# Patient Record
Sex: Female | Born: 1965 | Race: Black or African American | Hispanic: No | Marital: Married | Smoking: Current some day smoker
Health system: Southern US, Community
[De-identification: ages and names within clinical notes are randomized; demographics above are authoritative.]

## PROBLEM LIST (undated history)

## (undated) DIAGNOSIS — I10 Essential (primary) hypertension: Secondary | ICD-10-CM

## (undated) HISTORY — PX: NO PAST SURGERIES: SHX2092

---

## 1999-01-18 ENCOUNTER — Other Ambulatory Visit: Admission: RE | Admit: 1999-01-18 | Discharge: 1999-01-18 | Payer: Self-pay | Admitting: *Deleted

## 2002-11-09 ENCOUNTER — Emergency Department (HOSPITAL_COMMUNITY): Admission: EM | Admit: 2002-11-09 | Discharge: 2002-11-09 | Payer: Self-pay | Admitting: Emergency Medicine

## 2003-04-01 ENCOUNTER — Encounter: Admission: RE | Admit: 2003-04-01 | Discharge: 2003-04-01 | Payer: Self-pay | Admitting: Family Medicine

## 2003-04-01 ENCOUNTER — Encounter: Payer: Self-pay | Admitting: Family Medicine

## 2003-04-06 ENCOUNTER — Encounter: Payer: Self-pay | Admitting: Family Medicine

## 2003-04-06 ENCOUNTER — Ambulatory Visit (HOSPITAL_COMMUNITY): Admission: RE | Admit: 2003-04-06 | Discharge: 2003-04-06 | Payer: Self-pay | Admitting: Family Medicine

## 2003-06-14 ENCOUNTER — Emergency Department (HOSPITAL_COMMUNITY): Admission: EM | Admit: 2003-06-14 | Discharge: 2003-06-14 | Payer: Self-pay | Admitting: Emergency Medicine

## 2009-12-01 ENCOUNTER — Emergency Department (HOSPITAL_COMMUNITY): Admission: EM | Admit: 2009-12-01 | Discharge: 2009-12-01 | Payer: Self-pay | Admitting: Family Medicine

## 2011-03-04 DEATH — deceased

## 2012-01-04 DIAGNOSIS — L209 Atopic dermatitis, unspecified: Secondary | ICD-10-CM | POA: Insufficient documentation

## 2012-01-05 DIAGNOSIS — IMO0001 Reserved for inherently not codable concepts without codable children: Secondary | ICD-10-CM | POA: Insufficient documentation

## 2012-01-28 ENCOUNTER — Other Ambulatory Visit: Payer: Self-pay | Admitting: Family Medicine

## 2012-01-28 DIAGNOSIS — Z1231 Encounter for screening mammogram for malignant neoplasm of breast: Secondary | ICD-10-CM

## 2012-01-31 DIAGNOSIS — E559 Vitamin D deficiency, unspecified: Secondary | ICD-10-CM | POA: Insufficient documentation

## 2012-01-31 DIAGNOSIS — A599 Trichomoniasis, unspecified: Secondary | ICD-10-CM | POA: Insufficient documentation

## 2012-02-04 ENCOUNTER — Ambulatory Visit: Payer: Self-pay

## 2012-02-27 ENCOUNTER — Ambulatory Visit
Admission: RE | Admit: 2012-02-27 | Discharge: 2012-02-27 | Disposition: A | Discharge status: Home / Self Care | Payer: BC Managed Care – PPO | Source: Ambulatory Visit | Attending: Family Medicine | Admitting: Family Medicine

## 2012-02-27 DIAGNOSIS — Z1231 Encounter for screening mammogram for malignant neoplasm of breast: Secondary | ICD-10-CM

## 2012-07-24 DIAGNOSIS — L918 Other hypertrophic disorders of the skin: Secondary | ICD-10-CM | POA: Insufficient documentation

## 2012-07-24 DIAGNOSIS — B079 Viral wart, unspecified: Secondary | ICD-10-CM | POA: Insufficient documentation

## 2013-07-29 ENCOUNTER — Other Ambulatory Visit: Payer: Self-pay

## 2013-07-29 DIAGNOSIS — Z1231 Encounter for screening mammogram for malignant neoplasm of breast: Secondary | ICD-10-CM

## 2013-08-18 ENCOUNTER — Ambulatory Visit: Payer: BC Managed Care – PPO

## 2013-09-01 ENCOUNTER — Ambulatory Visit
Admission: RE | Admit: 2013-09-01 | Discharge: 2013-09-01 | Disposition: A | Discharge status: Home / Self Care | Payer: BC Managed Care – PPO | Source: Ambulatory Visit

## 2013-09-01 DIAGNOSIS — Z1231 Encounter for screening mammogram for malignant neoplasm of breast: Secondary | ICD-10-CM

## 2014-07-01 ENCOUNTER — Other Ambulatory Visit (HOSPITAL_COMMUNITY): Payer: Self-pay | Admitting: Family Medicine

## 2014-07-01 DIAGNOSIS — Z1231 Encounter for screening mammogram for malignant neoplasm of breast: Secondary | ICD-10-CM

## 2014-11-04 ENCOUNTER — Encounter: Payer: Self-pay | Admitting: Podiatry

## 2014-11-04 ENCOUNTER — Ambulatory Visit (INDEPENDENT_AMBULATORY_CARE_PROVIDER_SITE_OTHER): Payer: BC Managed Care – PPO

## 2014-11-04 ENCOUNTER — Ambulatory Visit (INDEPENDENT_AMBULATORY_CARE_PROVIDER_SITE_OTHER): Payer: BC Managed Care – PPO | Admitting: Podiatry

## 2014-11-04 VITALS — BP 164/112 | HR 89 | Resp 16

## 2014-11-04 DIAGNOSIS — M79672 Pain in left foot: Secondary | ICD-10-CM

## 2014-11-04 DIAGNOSIS — M898X9 Other specified disorders of bone, unspecified site: Secondary | ICD-10-CM

## 2014-11-04 DIAGNOSIS — M779 Enthesopathy, unspecified: Secondary | ICD-10-CM

## 2014-11-04 MED ORDER — TRIAMCINOLONE ACETONIDE 10 MG/ML IJ SUSP
10.0000 mg | Freq: Once | INTRAMUSCULAR | Status: AC
Start: 2014-11-04 — End: 2014-11-04
  Administered 2014-11-04: 10 mg

## 2014-11-04 NOTE — Progress Notes (Signed)
   Subjective:    Patient ID: Judith Payne, female    DOB: [birth year], 48 y.o.   MRN: [id number]  HPI Comments: "I have a place on top of this foot"  Patient c/o tender dorsal foot left for about 1 month. There is a knot. Uncomfortable with certain shoes. Standing for long periods makes worse. She has been soaking in epsom salt, rubbing alcohol on the area and applying icy hot.   Foot Pain Associated symptoms include a rash.      Review of Systems  Eyes: Positive for redness.  Skin: Positive for rash.  All other systems reviewed and are negative.      Objective:   Physical Exam        Assessment & Plan:

## 2014-11-04 NOTE — Progress Notes (Signed)
Subjective:     Patient ID: Judith Payne, female   DOB: 14-Apr-1966, 48 y.o.   MRN: [id number]  HPI patient presents stating I'm having pain on top of my left foot that's been there for about 4-6 weeks. Certain shoes bother more than others and I have tried icy hot without relief   Review of Systems  All other systems reviewed and are negative.      Objective:   Physical Exam  Constitutional: She is oriented to person, place, and time.  Cardiovascular: Intact distal pulses.   Musculoskeletal: Normal range of motion.  Neurological: She is oriented to person, place, and time.  Skin: Skin is warm.  Nursing note and vitals reviewed.  neurovascular status found to be intact with muscle strength adequate and range of motion subtalar midtarsal joint within normal limits. Patient is noted to have inflammation on the dorsal the left foot with fluid buildup around the midtarsal joint and extensor tendon inflammation. No indication of tendon tear or breakdown     Assessment:     Probable tendinitis dorsal left cannot rule out bone injury    Plan:     H&P and x-rays reviewed. Today I injected the dorsal tissue 3 mg Kenalog 5 mg Xylocaine and instructed on physical therapy and wider-type shoes. Reappoint to recheck again as needed if symptoms were to recur or persist

## 2014-11-12 ENCOUNTER — Ambulatory Visit: Payer: BC Managed Care – PPO

## 2015-07-26 ENCOUNTER — Emergency Department (INDEPENDENT_AMBULATORY_CARE_PROVIDER_SITE_OTHER)
Admission: EM | Admit: 2015-07-26 | Discharge: 2015-07-26 | Disposition: A | Discharge status: Home / Self Care | Payer: BC Managed Care – PPO | Source: Home / Self Care | Attending: Emergency Medicine | Admitting: Emergency Medicine

## 2015-07-26 ENCOUNTER — Encounter (HOSPITAL_COMMUNITY): Payer: Self-pay | Admitting: Emergency Medicine

## 2015-07-26 DIAGNOSIS — D179 Benign lipomatous neoplasm, unspecified: Secondary | ICD-10-CM | POA: Diagnosis not present

## 2015-07-26 DIAGNOSIS — I10 Essential (primary) hypertension: Secondary | ICD-10-CM

## 2015-07-26 DIAGNOSIS — R1900 Intra-abdominal and pelvic swelling, mass and lump, unspecified site: Secondary | ICD-10-CM | POA: Diagnosis not present

## 2015-07-26 HISTORY — DX: Essential (primary) hypertension: I10

## 2015-07-26 NOTE — ED Notes (Addendum)
Pt wore a girdle to church on Sunday and when she took it off she noticed a knot deep in her mid/right abdomen.  The knot does not cause any pain or discomfort.  When pt was checked in she was noted to have a BP of 212/146 in her left lower arm.  Her left upper arm was 199/135.  Pt is asymptomatic with no elevated or lowered HR, no CP, no SOB and no h/a.  Pt also stated she wanted a chest xray for a pulled muscle she thinks she has in her chest and also mentioned a mammogram. I explained to her that we do not do mammograms here and that an xray will not diagnose a pulled muscle.

## 2015-07-26 NOTE — ED Provider Notes (Signed)
CSN: [id number]     Arrival date & time 07/26/15  1737 History   First MD Initiated Contact with Patient 07/26/15 1944     Chief Complaint  Patient presents with  . Cyst  . Hypertension   (Consider location/radiation/quality/duration/timing/severity/associated sxs/prior Treatment) HPI Comments: 49 year old female states that after she pulled her gargle off Sunday, 2 days ago she felt a "knot" in her right upper quadrant just below the rib cage. She states that there is no pain or tenderness. She has no GI symptoms. No abdominal pain, nausea, vomiting or bowel problems. She denies tenderness with her palpation. Not increasing in size. Nothing makes it worse or better, is painless. Not associated with other sx's. No hx trauma.   Past Medical History  Diagnosis Date  . Hypertension    History reviewed. No pertinent past surgical history. History reviewed. No pertinent family history. Social History  Substance Use Topics  . Smoking status: Current Some Day Smoker  . Smokeless tobacco: None  . Alcohol Use: 0.0 oz/week    0 Standard drinks or equivalent per week   OB History    No data available     Review of Systems  Constitutional: Negative.   Respiratory: Negative.  Negative for cough, choking, chest tightness and shortness of breath.   Cardiovascular: Negative for chest pain, palpitations and leg swelling.  Gastrointestinal: Negative for nausea, vomiting, abdominal pain, constipation and abdominal distention.  Skin: Negative.   Neurological: Negative for tremors, syncope, speech difficulty and headaches.  Psychiatric/Behavioral: Negative.   All other systems reviewed and are negative.   Allergies  Review of patient's allergies indicates no known allergies.  Home Medications   Prior to Admission medications   Not on File   BP 212/146 mmHg  Pulse 73  Temp(Src) 98.5 F (36.9 C) (Oral)  Resp 16  SpO2 98% Physical Exam  Constitutional: She is oriented to person, place,  and time. She appears well-developed and well-nourished. No distress.  HENT:  Head: Normocephalic and atraumatic.  Eyes: EOM are normal. Pupils are equal, round, and reactive to light.  Neck: Normal range of motion. Neck supple.  Cardiovascular: Normal rate and normal heart sounds.   No murmur heard. Pulmonary/Chest: Effort normal and breath sounds normal. No respiratory distress. She has no wheezes.  Abdominal: Soft. There is no tenderness.  There is a firm mass in the right upper quadrant approximately 2.5-3 cm inferior to the costal margin. It is nontender. It is mobile and within the subcutaneous tissue. The liver is not palpable. There is no costal margin or rib pain. No abdominal tenderness. No pulsations associated with the mass.  Musculoskeletal: Normal range of motion.  Neurological: She is alert and oriented to person, place, and time. No cranial nerve deficit.  Skin: Skin is warm and dry.  Psychiatric: She has a normal mood and affect.  Nursing note and vitals reviewed.   ED Course  Procedures (including critical care time) Labs Review Labs Reviewed - No data to display  Imaging Review No results found.   MDM   1. Abdominal mass   2. Lipoma   3. Essential hypertension    Suspect this mobile mass in the right upper quadrant is a lipoma. However, it is advised that she see a PCP and possibly have this ultrasound for certainty. Her blood pressures are elevated and will need to have that managed as well. She is also advised to stop smoking. She may call in the PCP and she would like. She  was advised we do not have any referrals for PCP but may Google or call [facility] since you mentioned them.    Janne Napoleon, NP 07/26/15 2010  Janne Napoleon, NP 07/26/15 2011

## 2015-07-26 NOTE — Discharge Instructions (Signed)
Hypertension °Hypertension, commonly called high blood pressure, is when the force of blood pumping through your arteries is too strong. Your arteries are the blood vessels that carry blood from your heart throughout your body. A blood pressure reading consists of a higher number over a lower number, such as 110/72. The higher number (systolic) is the pressure inside your arteries when your heart pumps. The lower number (diastolic) is the pressure inside your arteries when your heart relaxes. Ideally you want your blood pressure below 120/80. °Hypertension forces your heart to work harder to pump blood. Your arteries may become narrow or stiff. Having hypertension puts you at risk for heart disease, stroke, and other problems.  °RISK FACTORS °Some risk factors for high blood pressure are controllable. Others are not.  °Risk factors you cannot control include:  °· Race. You may be at higher risk if you are African American. °· Age. Risk increases with age. °· Gender. Men are at higher risk than women before age 45 years. After age 65, women are at higher risk than men. °Risk factors you can control include: °· Not getting enough exercise or physical activity. °· Being overweight. °· Getting too much fat, sugar, calories, or salt in your diet. °· Drinking too much alcohol. °SIGNS AND SYMPTOMS °Hypertension does not usually cause signs or symptoms. Extremely high blood pressure (hypertensive crisis) may cause headache, anxiety, shortness of breath, and nosebleed. °DIAGNOSIS  °To check if you have hypertension, your health care provider will measure your blood pressure while you are seated, with your arm held at the level of your heart. It should be measured at least twice using the same arm. Certain conditions can cause a difference in blood pressure between your right and left arms. A blood pressure reading that is higher than normal on one occasion does not mean that you need treatment. If one blood pressure reading  is high, ask your health care provider about having it checked again. °TREATMENT  °Treating high blood pressure includes making lifestyle changes and possibly taking medicine. Living a healthy lifestyle can help lower high blood pressure. You may need to change some of your habits. °Lifestyle changes may include: °· Following the DASH diet. This diet is high in fruits, vegetables, and whole grains. It is low in salt, red meat, and added sugars. °· Getting at least 2½ hours of brisk physical activity every week. °· Losing weight if necessary. °· Not smoking. °· Limiting alcoholic beverages. °· Learning ways to reduce stress. ° If lifestyle changes are not enough to get your blood pressure under control, your health care provider may prescribe medicine. You may need to take more than one. Work closely with your health care provider to understand the risks and benefits. °HOME CARE INSTRUCTIONS °· Have your blood pressure rechecked as directed by your health care provider.   °· Take medicines only as directed by your health care provider. Follow the directions carefully. Blood pressure medicines must be taken as prescribed. The medicine does not work as well when you skip doses. Skipping doses also puts you at risk for problems.   °· Do not smoke.   °· Monitor your blood pressure at home as directed by your health care provider.  °SEEK MEDICAL CARE IF:  °· You think you are having a reaction to medicines taken. °· You have recurrent headaches or feel dizzy. °· You have swelling in your ankles. °· You have trouble with your vision. °SEEK IMMEDIATE MEDICAL CARE IF: °· You develop a severe headache or confusion. °·   You have unusual weakness, numbness, or feel faint.  You have severe chest or abdominal pain.  You vomit repeatedly.  You have trouble breathing. MAKE SURE YOU:   Understand these instructions.  Will watch your condition.  Will get help right away if you are not doing well or get worse. Document  Released: 11/19/2005 Document Revised: 04/05/2014 Document Reviewed: 09/11/2013 [facility] Patient Information 2015 [place], [place]. This information is not intended to replace advice given to you by your health care provider. Make sure you discuss any questions you have with your health care provider.  Heart Disease Prevention Heart disease can lead to heart attacks and strokes. This is a leading cause of death. Heart disease can be inherited and can be caused from the lifestyle you lead. You can do a lot to keep your heart and blood vessels healthy.  WHAT SHOULD I DO EACH DAY TO KEEP MY HEART HEALTHY?  Do not smoke.  Follow a healthy eating plan as recommended by your caregiver or dietitian.  Be active for a total of 30 minutes most days. Ask your caregiver what activities are best for you.  Limit the amount of alcohol you drink.  Involve family and friends to help you with a healthy lifestyle. HOW DOES HEART DISEASE CAUSE HIGH BLOOD PRESSURE?  Narrowed blood vessels leave a smaller opening for blood to flow through. It is like turning on a garden hose and holding your thumb over the opening. The smaller opening makes the water shoot out with more pressure. In the same way, narrowed blood vessels can lead to high blood pressure. Other factors, such as kidney problems and being overweight, also can lead to high blood pressure.  If you have high blood pressure you may need to take blood pressure medicine every day. Some types of blood pressure medicine can also help keep your kidneys healthy.  Many people with diabetes also have high blood pressure. If you have heart, eye, or kidney problems from diabetes, high blood pressure can make them worse. HOW DO MY BLOOD VESSELS GET CLOGGED?  Cholesterol is a substance that is made by the body and used for many important functions. It is also found in food that comes from animals. When your cholesterol is high, it can stick to the insides of your blood  vessels, making them narrowed and even clogged. This problem is called atherosclerosis.  Narrowed and clogged blood vessels make it harder for blood to get to important body organs. This can cause problems such as:  Chest pain (angina). Angina can cause temporary pain in your chest, arms, shoulders, or back. You may feel the pain more when your heart beats faster, such as when you exercise. The pain may go away when you rest. You also may feel very weak and sweaty.  A heart attack. A heart attack happens when a blood vessel in or near the heart becomes blocked. Not enough blood is getting to the heart. During a heart attack, you may have chest pain in your chest, arms, shoulders, or back along with nausea, indigestion, extreme weakness, and sweating. WHAT CAN I DO TO PREVENT HEART DISEASE?   Keep your blood pressure under control as recommended by your caregiver.  Keep your cholesterol under control. Have it checked at least once a year. Target cholesterol levels for most people are:  Total blood cholesterol level: Below 200.  LDL (bad) cholesterol: Below 100.  HDL (good) cholesterol: Above 40 in men and above 50 in women.  Triglycerides (  another type of fat in the blood): Below 150.  Make physical activity a part of your daily routine. Check with your caregiver to learn what activities are best for you.  Make sure that the foods you eat are "heart-healthy."  Include foods high in fiber, such as oat bran, oatmeal, whole-grain breads and cereals.  Cut back on fried foods and foods high in saturated fat. This includes foods such as meats, butter, whole dairy products, shortening, and coconut or palm oil.  Avoid salty foods such as canned food, luncheon meat, salty snacks, and fast food.  Eat more fruits and vegetables.  Drink less alcohol.  Lose weight as recommended by your caregiver.  If you smoke, quit. Your caregiver can help you with quitting options.  Ask your caregiver  whether you should take a daily aspirin. Studies have shown that taking aspirin can help reduce your risk of heart disease and stroke.  Take your prescribed medicines as directed. WHAT ARE THE WARNING SIGNS OF A HEART ATTACK? You may have one or more of the following warning signs:  Chest pain or discomfort.  Pain or discomfort in your arms, back, jaw, or neck.  Indigestion or stomach pain.  Shortness of breath.  Sweating.  Nausea or vomiting.  Lightheadedness.  No warning signs at all or they may come and go. FOR MORE INFORMATION  To find out more about heart disease and stroke prevention, visit the American Heart Association website at www.americanheart.org Document Released: 07/03/2004 Document Revised: 05/20/2012 Document Reviewed: 01/13/2014 [facility] Patient Information 2015 [place], [place]. This information is not intended to replace advice given to you by your health care provider. Make sure you discuss any questions you have with your health care provider.

## 2015-07-28 ENCOUNTER — Ambulatory Visit (INDEPENDENT_AMBULATORY_CARE_PROVIDER_SITE_OTHER): Payer: BC Managed Care – PPO | Admitting: Internal Medicine

## 2015-07-28 ENCOUNTER — Other Ambulatory Visit (INDEPENDENT_AMBULATORY_CARE_PROVIDER_SITE_OTHER): Payer: BC Managed Care – PPO

## 2015-07-28 ENCOUNTER — Encounter: Payer: Self-pay | Admitting: Internal Medicine

## 2015-07-28 VITALS — BP 160/120 | HR 85 | Temp 98.7°F | Resp 14 | Ht 63.0 in | Wt 177.3 lb

## 2015-07-28 DIAGNOSIS — R1900 Intra-abdominal and pelvic swelling, mass and lump, unspecified site: Secondary | ICD-10-CM

## 2015-07-28 DIAGNOSIS — Z0000 Encounter for general adult medical examination without abnormal findings: Secondary | ICD-10-CM | POA: Diagnosis not present

## 2015-07-28 DIAGNOSIS — I10 Essential (primary) hypertension: Secondary | ICD-10-CM

## 2015-07-28 LAB — COMPREHENSIVE METABOLIC PANEL
ALK PHOS: 83 U/L (ref 39–117)
ALT: 40 U/L — ABNORMAL HIGH (ref 0–35)
AST: 27 U/L (ref 0–37)
Albumin: 4.3 g/dL (ref 3.5–5.2)
BUN: 17 mg/dL (ref 6–23)
CO2: 30 meq/L (ref 19–32)
Calcium: 10.5 mg/dL (ref 8.4–10.5)
Chloride: 103 mEq/L (ref 96–112)
Creatinine, Ser: 0.89 mg/dL (ref 0.40–1.20)
GFR: 86.14 mL/min (ref >60.00)
GLUCOSE: 125 mg/dL — AB (ref 70–99)
POTASSIUM: 4.7 meq/L (ref 3.5–5.1)
Sodium: 139 mEq/L (ref 135–145)
Total Bilirubin: 0.4 mg/dL (ref 0.2–1.2)
Total Protein: 7.6 g/dL (ref 6.0–8.3)

## 2015-07-28 LAB — LIPID PANEL
CHOL/HDL RATIO: 5
CHOLESTEROL: 208 mg/dL — AB (ref 0–200)
HDL: 45.3 mg/dL (ref >39.00)
LDL CALC: 143 mg/dL — AB (ref 0–99)
NonHDL: 162.51
TRIGLYCERIDES: 97 mg/dL (ref 0.0–149.0)
VLDL: 19.4 mg/dL (ref 0.0–40.0)

## 2015-07-28 LAB — CBC
HEMATOCRIT: 45.8 % (ref 36.0–46.0)
Hemoglobin: 15.1 g/dL — ABNORMAL HIGH (ref 12.0–15.0)
MCHC: 32.8 g/dL (ref 30.0–36.0)
MCV: 84.1 fl (ref 78.0–100.0)
PLATELETS: 253 10*3/uL (ref 150.0–400.0)
RBC: 5.45 Mil/uL — ABNORMAL HIGH (ref 3.87–5.11)
RDW: 14.3 % (ref 11.5–15.5)
WBC: 6.6 10*3/uL (ref 4.0–10.5)

## 2015-07-28 LAB — HEMOGLOBIN A1C: Hgb A1c MFr Bld: 5.8 % (ref 4.6–6.5)

## 2015-07-28 MED ORDER — LISINOPRIL-HYDROCHLOROTHIAZIDE 20-12.5 MG PO TABS: 1.0000 | ORAL_TABLET | Freq: Every day | ORAL | Status: DC

## 2015-07-28 NOTE — Progress Notes (Signed)
   Subjective:    Patient ID: Judith Payne, female    DOB: 1966/08/22, 49 y.o.   MRN: [id number]  HPI The patient is a 49 YO female coming in for high blood pressure. She was going to urgent care to check out a lump on her stomach and had severe hypertension. She was not given any medication but told to see a PCP from urgent care. Not feeling that well but no headaches. No chest pains or SOB or nausea or confusion. Has not been on any medications.   Review of Systems  Constitutional: Positive for fatigue. Negative for fever, chills, activity change, appetite change and unexpected weight change.  HENT: Negative.   Eyes: Negative.   Respiratory: Negative for cough, chest tightness, shortness of breath and wheezing.   Cardiovascular: Negative for chest pain, palpitations and leg swelling.  Gastrointestinal: Negative for nausea, abdominal pain, diarrhea, constipation and abdominal distention.  Musculoskeletal: Negative.   Skin: Negative.   Neurological: Negative for dizziness, weakness, light-headedness and headaches.  Psychiatric/Behavioral: Negative.       Objective:   Physical Exam  Constitutional: She is oriented to person, place, and time. She appears well-developed and well-nourished.  HENT:  Head: Normocephalic and atraumatic.  Eyes: EOM are normal. Pupils are equal, round, and reactive to light.  Neck: Normal range of motion.  Cardiovascular: Normal rate and regular rhythm.   Pulmonary/Chest: Effort normal and breath sounds normal. No respiratory distress. She has no wheezes. She has no rales.  Abdominal: Soft. Bowel sounds are normal. She exhibits no distension. There is no tenderness. There is no rebound.  Musculoskeletal: She exhibits no edema.  Neurological: She is alert and oriented to person, place, and time. Coordination normal.  Skin: Skin is warm and dry.  Psychiatric: She has a normal mood and affect.   Filed Vitals:   07/28/15 0857  BP: 160/120  Pulse: 85  Temp:  98.7 F (37.1 C)  TempSrc: Oral  Resp: 14  Height: 5\' 3"  (1.6 m)  Weight: 177 lb 4.8 oz (80.423 kg)  SpO2: 98%      Assessment & Plan:

## 2015-07-28 NOTE — Progress Notes (Signed)
Pre visit review using our clinic review tool, if applicable. No additional management support is needed unless otherwise documented below in the visit note. 

## 2015-07-28 NOTE — Patient Instructions (Signed)
We will check the blood tests today and send in a blood pressure medicine for you. We will call you back with the results.   The medicine is called lisinopril/hctz and is 1 pill a day. This pill does tend to make you urinate more often and this is part of how it helps the blood pressure.   If you have any side effects or problems from the medicine please feel free to call us and we can help you.   Come back in 2-3 weeks so we can check the blood pressure and the blood work.    Hypertension Hypertension is another name for high blood pressure. High blood pressure forces your heart to work harder to pump blood. A blood pressure reading has two numbers, which includes a higher number over a lower number (example: 110/72). HOME CARE   Have your blood pressure rechecked by your doctor.  Only take medicine as told by your doctor. Follow the directions carefully. The medicine does not work as well if you skip doses. Skipping doses also puts you at risk for problems.  Do not smoke.  Monitor your blood pressure at home as told by your doctor. GET HELP IF:  You think you are having a reaction to the medicine you are taking.  You have repeat headaches or feel dizzy.  You have puffiness (swelling) in your ankles.  You have trouble with your vision. GET HELP RIGHT AWAY IF:   You get a very bad headache and are confused.  You feel weak, numb, or faint.  You get chest or belly (abdominal) pain.  You throw up (vomit).  You cannot breathe very well. MAKE SURE YOU:   Understand these instructions.  Will watch your condition.  Will get help right away if you are not doing well or get worse. Document Released: 05/07/2008 Document Revised: 11/24/2013 Document Reviewed: 09/11/2013 [facility] Patient Information 2015 [place], [place]. This information is not intended to replace advice given to you by your health care provider. Make sure you discuss any questions you have with your health care  provider.

## 2015-07-29 LAB — HIV ANTIBODY (ROUTINE TESTING W REFLEX): HIV 1&2 Ab, 4th Generation: NONREACTIVE

## 2015-07-31 DIAGNOSIS — R1900 Intra-abdominal and pelvic swelling, mass and lump, unspecified site: Secondary | ICD-10-CM | POA: Insufficient documentation

## 2015-07-31 DIAGNOSIS — I10 Essential (primary) hypertension: Secondary | ICD-10-CM | POA: Insufficient documentation

## 2015-07-31 NOTE — Assessment & Plan Note (Addendum)
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 Check US abdomen to confirm.

## 2015-07-31 NOTE — Assessment & Plan Note (Signed)
Starting her on 2 drug regimen with lisinopril/hctz. Checking CMP, CBC, lipid panel, HIV screening and adjust as needed. Return soon for BP recheck and labs after starting medication.

## 2015-08-02 ENCOUNTER — Emergency Department (HOSPITAL_COMMUNITY): Payer: BC Managed Care – PPO

## 2015-08-02 ENCOUNTER — Telehealth: Payer: Self-pay | Admitting: Internal Medicine

## 2015-08-02 ENCOUNTER — Observation Stay (HOSPITAL_COMMUNITY)
Admission: EM | Admit: 2015-08-02 | Discharge: 2015-08-02 | Disposition: A | Discharge status: Home / Self Care | Payer: BC Managed Care – PPO | Attending: Emergency Medicine | Admitting: Emergency Medicine

## 2015-08-02 ENCOUNTER — Encounter (HOSPITAL_COMMUNITY): Payer: Self-pay | Admitting: Emergency Medicine

## 2015-08-02 DIAGNOSIS — R079 Chest pain, unspecified: Secondary | ICD-10-CM | POA: Diagnosis not present

## 2015-08-02 DIAGNOSIS — E785 Hyperlipidemia, unspecified: Secondary | ICD-10-CM | POA: Insufficient documentation

## 2015-08-02 DIAGNOSIS — I160 Hypertensive urgency: Secondary | ICD-10-CM | POA: Diagnosis present

## 2015-08-02 DIAGNOSIS — F17210 Nicotine dependence, cigarettes, uncomplicated: Secondary | ICD-10-CM | POA: Insufficient documentation

## 2015-08-02 DIAGNOSIS — R0789 Other chest pain: Secondary | ICD-10-CM | POA: Diagnosis not present

## 2015-08-02 DIAGNOSIS — I10 Essential (primary) hypertension: Secondary | ICD-10-CM | POA: Diagnosis not present

## 2015-08-02 DIAGNOSIS — M79602 Pain in left arm: Secondary | ICD-10-CM | POA: Diagnosis not present

## 2015-08-02 LAB — BASIC METABOLIC PANEL
Anion gap: 10 (ref 5–15)
BUN: 18 mg/dL (ref 6–20)
CO2: 24 mmol/L (ref 22–32)
Calcium: 9.9 mg/dL (ref 8.9–10.3)
Chloride: 104 mmol/L (ref 101–111)
Creatinine, Ser: 0.89 mg/dL (ref 0.44–1.00)
GFR calc Af Amer: >60 mL/min (ref >60)
GLUCOSE: 93 mg/dL (ref 65–99)
POTASSIUM: 3.8 mmol/L (ref 3.5–5.1)
Sodium: 138 mmol/L (ref 135–145)

## 2015-08-02 LAB — CBC
HCT: 46.3 % — ABNORMAL HIGH (ref 36.0–46.0)
HEMOGLOBIN: 15.2 g/dL — AB (ref 12.0–15.0)
MCH: 28.2 pg (ref 26.0–34.0)
MCHC: 32.8 g/dL (ref 30.0–36.0)
MCV: 85.9 fL (ref 78.0–100.0)
Platelets: 288 10*3/uL (ref 150–400)
RBC: 5.39 MIL/uL — ABNORMAL HIGH (ref 3.87–5.11)
RDW: 14.4 % (ref 11.5–15.5)
WBC: 7.4 10*3/uL (ref 4.0–10.5)

## 2015-08-02 LAB — I-STAT TROPONIN, ED: TROPONIN I, POC: 0 ng/mL (ref 0.00–0.08)

## 2015-08-02 LAB — TROPONIN I: Troponin I: <0.03 ng/mL (ref <0.031)

## 2015-08-02 MED ORDER — ASPIRIN 81 MG PO CHEW
324.0000 mg | CHEWABLE_TABLET | Freq: Once | ORAL | Status: AC
  Administered 2015-08-02: 324 mg via ORAL
  Filled 2015-08-02: qty 4

## 2015-08-02 MED ORDER — NITROGLYCERIN 0.4 MG SL SUBL
0.4000 mg | SUBLINGUAL_TABLET | SUBLINGUAL | Status: DC | PRN
  Administered 2015-08-02: 0.4 mg via SUBLINGUAL
  Filled 2015-08-02: qty 1

## 2015-08-02 MED ORDER — CARVEDILOL 12.5 MG PO TABS: 12.5000 mg | ORAL_TABLET | Freq: Two times a day (BID) | ORAL | Status: DC

## 2015-08-02 MED ORDER — CARVEDILOL 12.5 MG PO TABS
12.5000 mg | ORAL_TABLET | Freq: Once | ORAL | Status: AC
  Administered 2015-08-02: 12.5 mg via ORAL
  Filled 2015-08-02: qty 1

## 2015-08-02 NOTE — Telephone Encounter (Signed)
Patient stated that her B/P is 182/115 right know, she is going to the ER

## 2015-08-02 NOTE — ED Notes (Signed)
Pt reports left arm soreness that began at 9pm last night. Pt reports the soreness in her left arm, tightness in her chest and elevated blood pressure which worsened this AM made her come to the ED. Pt alert, oriented x4, skin, warm, dry.

## 2015-08-02 NOTE — Discharge Instructions (Signed)
Cleared by internal medicine for discharge home. Follow-up with your primary care doctor. Start a new medication Coreg twice a day. Return for any new or worse symptoms. Return for the development of any chest pain that lasts for 15 minutes or longer.

## 2015-08-02 NOTE — ED Provider Notes (Signed)
CSN: [id number]     Arrival date & time 08/02/15  1059 History   First MD Initiated Contact with Patient 08/02/15 1217     Chief Complaint  Patient presents with  . Arm Pain  . Hypertension     (Consider location/radiation/quality/duration/timing/severity/associated sxs/prior Treatment) Patient is a 49 y.o. female presenting with arm pain and hypertension. The history is provided by the patient.  Arm Pain Associated symptoms include chest pain. Pertinent negatives include no abdominal pain, no headaches and no shortness of breath.  Hypertension Associated symptoms include chest pain. Pertinent negatives include no abdominal pain, no headaches and no shortness of breath.   patient with acute onset of left-sided chest pain radiating to the shoulder while at work this morning at around 10 in the morning. Pain is been constant still present. States that the pain is ranging from 5-8 out of 10. Not associated with shortness of breath no nausea vomiting. Did not have any aspirin. Patient is followed by [facility]. Patient recently started on blood pressure medicine on Thursday also new diagnosis of hyperlipidemia. Patient is a recent smoker but has quit. No family history of early or premature cardiac problems. Patient also states that starting on Sunday had some mild left arm pain. Also has been having some shoulder discomfort.  Past Medical History  Diagnosis Date  . Hypertension    History reviewed. No pertinent past surgical history. Family History  Problem Relation Age of Onset  . Hypertension Mother   . Diabetes Mother   . Cancer Father    Social History  Substance Use Topics  . Smoking status: Current Some Day Smoker  . Smokeless tobacco: None  . Alcohol Use: 0.0 oz/week    0 Standard drinks or equivalent per week   OB History    No data available     Review of Systems  Constitutional: Negative for fever.  HENT: Negative for congestion.   Eyes: Negative for visual  disturbance.  Respiratory: Negative for shortness of breath.   Cardiovascular: Positive for chest pain.  Gastrointestinal: Negative for nausea, vomiting and abdominal pain.  Genitourinary: Negative for dysuria.  Musculoskeletal: Negative for back pain.  Neurological: Negative for headaches.  Hematological: Does not bruise/bleed easily.  Psychiatric/Behavioral: Negative for confusion.      Allergies  Review of patient's allergies indicates no known allergies.  Home Medications   Prior to Admission medications   Medication Sig Start Date End Date Taking? Authorizing Provider  lisinopril-hydrochlorothiazide (ZESTORETIC) 20-12.5 MG per tablet Take 1 tablet by mouth daily. 07/28/15  Yes Olga Millers, MD  naproxen sodium (ANAPROX) 220 MG tablet Take 220 mg by mouth once.   Yes Historical Provider, MD   BP 169/90 mmHg  Pulse 67  Temp(Src) 97.8 F (36.6 C) (Oral)  Resp 17  SpO2 100% Physical Exam  Constitutional: She is oriented to person, place, and time. She appears well-developed and well-nourished. No distress.  HENT:  Head: Normocephalic and atraumatic.  Mouth/Throat: Oropharynx is clear and moist.  Eyes: Conjunctivae and EOM are normal. Pupils are equal, round, and reactive to light.  Neck: Normal range of motion. Neck supple.  Cardiovascular: Normal rate, regular rhythm and normal heart sounds.   No murmur heard. Pulmonary/Chest: Effort normal and breath sounds normal. No respiratory distress.  Abdominal: Soft. Bowel sounds are normal. There is no tenderness.  Musculoskeletal: Normal range of motion. She exhibits no edema.  Neurological: She is alert and oriented to person, place, and time. No cranial nerve  deficit. She exhibits normal muscle tone. Coordination normal.  Skin: Skin is warm. No erythema.  Nursing note and vitals reviewed.   ED Course  Procedures (including critical care time) Labs Review Labs Reviewed  CBC - Abnormal; Notable for the following:     RBC 5.39 (*)    Hemoglobin 15.2 (*)    HCT 46.3 (*)    All other components within normal limits  BASIC METABOLIC PANEL  TROPONIN I  URINE RAPID DRUG SCREEN, HOSP PERFORMED  I-STAT TROPOININ, ED   Results for orders placed or performed during the hospital encounter of 23/53/61  Basic metabolic panel  Result Value Ref Range   Sodium 138 135 - 145 mmol/L   Potassium 3.8 3.5 - 5.1 mmol/L   Chloride 104 101 - 111 mmol/L   CO2 24 22 - 32 mmol/L   Glucose, Bld 93 65 - 99 mg/dL   BUN 18 6 - 20 mg/dL   Creatinine, Ser 0.89 0.44 - 1.00 mg/dL   Calcium 9.9 8.9 - 10.3 mg/dL   GFR calc non Af Amer >60 >60 mL/min   GFR calc Af Amer >60 >60 mL/min   Anion gap 10 5 - 15  Troponin I  Result Value Ref Range   Troponin I <0.03 <0.031 ng/mL  CBC  Result Value Ref Range   WBC 7.4 4.0 - 10.5 K/uL   RBC 5.39 (H) 3.87 - 5.11 MIL/uL   Hemoglobin 15.2 (H) 12.0 - 15.0 g/dL   HCT 46.3 (H) 36.0 - 46.0 %   MCV 85.9 78.0 - 100.0 fL   MCH 28.2 26.0 - 34.0 pg   MCHC 32.8 30.0 - 36.0 g/dL   RDW 14.4 11.5 - 15.5 %   Platelets 288 150 - 400 K/uL  I-stat troponin, ED  Result Value Ref Range   Troponin i, poc 0.00 0.00 - 0.08 ng/mL   Comment 3             Imaging Review Dg Chest 2 View  08/02/2015   CLINICAL DATA:  Chest pain, left-sided  EXAM: CHEST  2 VIEW  COMPARISON:  None.  FINDINGS: Normal heart size and mediastinal contours. No acute infiltrate or edema. No effusion or pneumothorax. No acute osseous findings.  IMPRESSION: Negative chest.   Electronically Signed   By: Monte Fantasia M.D.   On: 08/02/2015 12:13   I have personally reviewed and evaluated these images and lab results as part of my medical decision-making.   EKG Interpretation   Date/Time:  Tuesday August 02 2015 11:04:49 EDT Ventricular Rate:  73 PR Interval:  174 QRS Duration: 82 QT Interval:  400 QTC Calculation: 440 R Axis:   81 Text Interpretation:  Normal sinus rhythm with sinus arrhythmia Normal ECG  No previous  ECGs available Confirmed by Avo Schlachter  MD, Davey Limas ([id number]) on  08/02/2015 12:18:21 PM      MDM   Final diagnoses:  Chest pain, unspecified chest pain type  Essential hypertension    Original plan was admission for cardiac rule out to telemetry. The patient now is pain-free is had a second troponin that was negative. Will be evaluated by hospitalist service patient in discussion with them may be cleared for discharge home and follow-up with her primary care doctor for outpatient workup as well as blood pressure control. They may recommend adding a beta blocker.   Patient does have risk factors in that she is a past smoker recently quit known to have hypertension is poorly controlled and recent  hyperlipidemia.  Patient's chest pain of sudden onset today at around 10 left-sided she had some prior pain in the left arm that may be noncardiac. However the concern was for left-sided chest pain that occurred at work C and work clinic and then referred in for further evaluation appropriately.  Chest x-ray negative for any acute findings. EKG very normal.  Patient's pain did improve with aspirin and nitroglycerin.  Fredia Sorrow, MD 08/02/15 [phone]

## 2015-08-02 NOTE — ED Notes (Signed)
Patient states she has been informed she will be discharged home, will follow up with patients plan of care

## 2015-08-02 NOTE — Consult Note (Signed)
[facility] Medical Consultation  Daylee Sloss ZHG:[id number] DOB: 1966/09/20 DOA: 08/02/2015 PCP: Olga Millers, MD   Requesting physician: Dr. Rogene Houston Date of consultation: 08/02/2015 Reason for consultation: Possible chest pain rule out admission  Impression/Recommendations Principal Problem:   Hypertensive urgency Active Problems:   Chest pain   Hypertensive urgency Uncertain etiology. Recommend the addition of Coreg 12.5 mg twice a day to her current blood pressure medication regimen.  Currently, after treatment in the ER the patient's blood pressure has dropped to 169/90. I discussed weight loss, low salt diet, medication compliance, and cardiology follow-up at length with the patient. She acknowledged that she understood how important follow-up was and will do as requested.  Atypical chest pain Patient experienced left arm and shoulder pain that has been relieved with Aleve and nitroglycerin. Given her heart score of 3, negative workup, and improved pain, it was felt she was safe for outpatient workup. She has been advised to avoid Aleve. She will follow-up with [facility] ([facility]viously [facility] for evaluation and a possible stress test.  The patient knows to return to the emergency room if she develops chest pain, nausea, vomiting, or diaphoresis.   Chief Complaint: Hypertension, Left arm pain  HPI:  Ms Steven is a 49 year old female with hypertension who presents to the ED for high blood pressure. She reports that last Tuesday she was having a nodule in her right upper quadrant evaluated at urgent care. Incidentally blood pressure was discovered to be 220/130. She was sent to her primary care physician. She was started on a blood pressure medication (combination HCTZ and lisinopril). Her blood pressure was still elevated but had come down significantly. On Sunday she noticed pain in her left arm she thought it was from repeated use of a blood pressure cuff.  She took Aleve and felt some relief. The pain was intermittent and continued into Monday. Again, she took Aleve and felt some relief. This morning at work the pain in her left arm spread up to her shoulder she became concerned and went to the nurse at work. Her blood pressure was elevated. She called her primary care physician who directed her over the telephone to come to the emergency department. In the ER her blood pressure was 224/118. She had some epigastric/central chest discomfort initially. Nitroglycerin relieved the discomfort.  EKG was normal. Troponin was less than 0.03. Chest x-ray was negative for source of chest discomfort. The patient has a recent history of smoking.  LDL checked 07/28/2015 was 143.  [facility] was called for consideration to admit for chest pain rule out.   Review of Systems:  She denies headache, sore throat, fever, cough, dyspnea on exertion, decreased exercise tolerance, orthopnea, PND, palpitations, dizziness.  Past Medical History  Diagnosis Date  . Hypertension    History reviewed. No pertinent past surgical history. Social History:  reports that she has been smoking.  She does not have any smokeless tobacco history on file. She reports that she drinks alcohol. Her drug history is not on file.  She smoked tobacco for approximately 5 years and has since stopped smoking. She drinks approximately 1 glass of wine each evening. She is independent ADLs  No Known Allergies Family History  Problem Relation Age of Onset  . Hypertension Mother   . Diabetes Mother   . Cancer Father    the patient knows of no immediate relatives with cardiac disease  Prior to Admission medications   Medication Sig Start Date End Date  Taking? Authorizing Provider  lisinopril-hydrochlorothiazide (ZESTORETIC) 20-12.5 MG per tablet Take 1 tablet by mouth daily. 07/28/15  Yes Olga Millers, MD  naproxen sodium (ANAPROX) 220 MG tablet Take 220 mg by mouth once.   Yes  Historical Provider, MD   Physical Exam: Filed Vitals:   08/02/15 1300 08/02/15 1344 08/02/15 1419 08/02/15 1430  BP: 200/109  175/98 169/90  Pulse: 70 70 61 67  Temp:      TempSrc:      Resp: 19 20 15 17   SpO2: 100% 100% 99% 100%     General:  Overweight, pleasant female, no apparent distress, family at bedside  Eyes: Pupils equal and round, sclera clear, conjunctiva pink  ENT: No erythema or exudates in her oropharynx  Neck: Supple, no JVD  Cardiovascular: Regular rate and rhythm, no murmurs rubs or gallops, no lower extremity edema  Respiratory: Clear auscultation, no wheezes crackles or rales  Abdomen: Soft, nontender, nondistended. Positive bowel sounds, small approximately 1 inch soft mobile nodule in right upper quadrant  Skin: No rashes bruises or lesions  Musculoskeletal: Full range of motion, no effusions  Psychiatric: Appropriate, alert and oriented, cooperative   Labs on Admission:  Basic Metabolic Panel:  Recent Labs Lab 07/28/15 0944 08/02/15 1122  NA 139 138  K 4.7 3.8  CL 103 104  CO2 30 24  GLUCOSE 125* 93  BUN 17 18  CREATININE 0.89 0.89  CALCIUM 10.5 9.9   Liver Function Tests:  Recent Labs Lab 07/28/15 0944  AST 27  ALT 40*  ALKPHOS 83  BILITOT 0.4  PROT 7.6  ALBUMIN 4.3   CBC:  Recent Labs Lab 07/28/15 0944 08/02/15 1122  WBC 6.6 7.4  HGB 15.1* 15.2*  HCT 45.8 46.3*  MCV 84.1 85.9  PLT 253.0 288   Cardiac Enzymes:  Recent Labs Lab 08/02/15 1122  TROPONINI <0.03    Radiological Exams on Admission: Dg Chest 2 View  08/02/2015   CLINICAL DATA:  Chest pain, left-sided  EXAM: CHEST  2 VIEW  COMPARISON:  None.  FINDINGS: Normal heart size and mediastinal contours. No acute infiltrate or edema. No effusion or pneumothorax. No acute osseous findings.  IMPRESSION: Negative chest.   Electronically Signed   By: Monte Fantasia M.D.   On: 08/02/2015 12:13    EKG: NSR, No ST changes.  Time spent: [address] [facility] Pager [phone]  If 7PM-7AM, please contact night-coverage [email or web address] Password [email or web address] 08/02/2015, 3:29 PM

## 2015-08-02 NOTE — Telephone Encounter (Signed)
Okay 

## 2015-08-03 ENCOUNTER — Telehealth: Payer: Self-pay | Admitting: Internal Medicine

## 2015-08-03 ENCOUNTER — Emergency Department (HOSPITAL_COMMUNITY): Payer: BC Managed Care – PPO

## 2015-08-03 ENCOUNTER — Emergency Department (HOSPITAL_COMMUNITY)
Admission: EM | Admit: 2015-08-03 | Discharge: 2015-08-04 | Disposition: A | Discharge status: Home / Self Care | Payer: BC Managed Care – PPO | Attending: Emergency Medicine | Admitting: Emergency Medicine

## 2015-08-03 ENCOUNTER — Encounter (HOSPITAL_COMMUNITY): Payer: Self-pay | Admitting: *Deleted

## 2015-08-03 DIAGNOSIS — Z720 Tobacco use: Secondary | ICD-10-CM | POA: Insufficient documentation

## 2015-08-03 DIAGNOSIS — R079 Chest pain, unspecified: Secondary | ICD-10-CM | POA: Diagnosis not present

## 2015-08-03 DIAGNOSIS — Z79899 Other long term (current) drug therapy: Secondary | ICD-10-CM | POA: Diagnosis not present

## 2015-08-03 DIAGNOSIS — I10 Essential (primary) hypertension: Secondary | ICD-10-CM | POA: Insufficient documentation

## 2015-08-03 DIAGNOSIS — R202 Paresthesia of skin: Secondary | ICD-10-CM | POA: Diagnosis not present

## 2015-08-03 LAB — BASIC METABOLIC PANEL
ANION GAP: 11 (ref 5–15)
BUN: 15 mg/dL (ref 6–20)
CHLORIDE: 105 mmol/L (ref 101–111)
CO2: 23 mmol/L (ref 22–32)
CREATININE: 0.83 mg/dL (ref 0.44–1.00)
Calcium: 10 mg/dL (ref 8.9–10.3)
GFR calc non Af Amer: >60 mL/min (ref >60)
Glucose, Bld: 94 mg/dL (ref 65–99)
POTASSIUM: 3.8 mmol/L (ref 3.5–5.1)
SODIUM: 139 mmol/L (ref 135–145)

## 2015-08-03 LAB — CBC
HEMATOCRIT: 44.7 % (ref 36.0–46.0)
HEMOGLOBIN: 14.7 g/dL (ref 12.0–15.0)
MCH: 27.8 pg (ref 26.0–34.0)
MCHC: 32.9 g/dL (ref 30.0–36.0)
MCV: 84.7 fL (ref 78.0–100.0)
PLATELETS: 284 10*3/uL (ref 150–400)
RBC: 5.28 MIL/uL — AB (ref 3.87–5.11)
RDW: 13.9 % (ref 11.5–15.5)
WBC: 7 10*3/uL (ref 4.0–10.5)

## 2015-08-03 LAB — I-STAT TROPONIN, ED: Troponin i, poc: 0.01 ng/mL (ref 0.00–0.08)

## 2015-08-03 MED ORDER — ASPIRIN 81 MG PO CHEW
324.0000 mg | CHEWABLE_TABLET | Freq: Once | ORAL | Status: AC
  Administered 2015-08-03: 324 mg via ORAL
  Filled 2015-08-03: qty 4

## 2015-08-03 MED ORDER — KETOROLAC TROMETHAMINE 60 MG/2ML IM SOLN
60.0000 mg | Freq: Once | INTRAMUSCULAR | Status: AC
  Administered 2015-08-03: 60 mg via INTRAMUSCULAR
  Filled 2015-08-03: qty 2

## 2015-08-03 MED ORDER — KETOROLAC TROMETHAMINE 30 MG/ML IJ SOLN: 30.0000 mg | Freq: Once | INTRAMUSCULAR | Status: DC

## 2015-08-03 MED ORDER — CARVEDILOL 12.5 MG PO TABS
12.5000 mg | ORAL_TABLET | Freq: Once | ORAL | Status: AC
  Administered 2015-08-03: 12.5 mg via ORAL
  Filled 2015-08-03: qty 1

## 2015-08-03 NOTE — ED Notes (Signed)
Pt states that she took this morning dose of BP meds and is due to take another dose now but has not eaten so she did not take it.

## 2015-08-03 NOTE — Telephone Encounter (Signed)
Patient Name: Judith Payne  DOB: 10-19-1966    Initial Comment Seen in ER yesterday due to chest and arm pain, questions about continuing arm pain and some chest discomfort   Nurse Assessment  Nurse: Thad Ranger RN, Langley Gauss Date/Time (Eastern Time): 08/03/2015 3:12:37 PM  Confirm and document reason for call. If symptomatic, describe symptoms. ---Pt c/o Chest and arm pain. Seen in ER yesterday and the MD wanted to admit her but decided to put her on another BP med. Denies SOB  Has the patient traveled out of the country within the last 30 days? ---Not Applicable  Does the patient require triage? ---Yes  Related visit to physician within the last 2 weeks? ---Yes  Does the PT have any chronic conditions? (i.e. diabetes, asthma, etc.) ---Yes  List chronic conditions. ---HTN  Did the patient indicate they were pregnant? ---No     Guidelines    Guideline Title Affirmed Question Affirmed Notes  Chest Pain [1] Chest pain lasts > 5 minutes AND [2] age > 54    Final Disposition User   Call EMS 911 Now Carmon, RN, Langley Gauss    Comments  Refused to call 911 and will have her son take her to the ER. Advised to take a list or all bottles of meds that she takes both Rx and OTC w/her to ER   Disagree/Comply: Disagree  Disagree/Comply Reason: Disagree with instructions

## 2015-08-03 NOTE — ED Notes (Signed)
Pt reports continued chest pain for 3-4 days. Pt states that she was seen for the same yesterday. Pain continued and her dr told her to return. Pt also reports receiving a new bp medication yesterday and taking that today.

## 2015-08-03 NOTE — ED Provider Notes (Signed)
CSN: [id number]     Arrival date & time 08/03/15  1551 History   First MD Initiated Contact with Patient 08/03/15 1807     Chief Complaint  Patient presents with  . Chest Pain     (Consider location/radiation/quality/duration/timing/severity/associated sxs/prior Treatment) Patient is a 49 y.o. female presenting with chest pain.  Chest Pain Pain location:  L chest Pain quality: pressure   Pain radiates to:  L arm Pain radiates to the back: no   Pain severity:  Moderate Onset quality:  Gradual Duration:  2 weeks Timing:  Intermittent Progression:  Waxing and waning Chronicity:  New Context: at rest   Relieved by: activity. Worsened by:  Certain positions Ineffective treatments:  None tried Associated symptoms: no abdominal pain, no cough, no fever, no headache, no nausea, no shortness of breath and not vomiting   Risk factors: high cholesterol and hypertension   Risk factors: no diabetes mellitus, no prior DVT/PE, no smoking and no surgery     Past Medical History  Diagnosis Date  . Hypertension    History reviewed. No pertinent past surgical history. Family History  Problem Relation Age of Onset  . Hypertension Mother   . Diabetes Mother   . Cancer Father    Social History  Substance Use Topics  . Smoking status: Current Some Day Smoker  . Smokeless tobacco: None  . Alcohol Use: 0.0 oz/week    0 Standard drinks or equivalent per week   OB History    No data available     Review of Systems  Constitutional: Negative for fever and chills.  HENT: Negative for congestion and sore throat.   Eyes: Negative for visual disturbance.  Respiratory: Negative for cough, shortness of breath and wheezing.   Cardiovascular: Positive for chest pain. Negative for leg swelling.  Gastrointestinal: Negative for nausea, vomiting, abdominal pain, diarrhea and constipation.  Genitourinary: Negative for dysuria, difficulty urinating and vaginal pain.  Musculoskeletal: Positive for  myalgias. Negative for arthralgias.  Skin: Negative for rash.  Neurological: Negative for syncope and headaches.  Psychiatric/Behavioral: Negative for behavioral problems.  All other systems reviewed and are negative.     Allergies  Review of patient's allergies indicates no known allergies.  Home Medications   Prior to Admission medications   Medication Sig Start Date End Date Taking? Authorizing Provider  carvedilol (COREG) 12.5 MG tablet Take 1 tablet (12.5 mg total) by mouth 2 (two) times daily with a meal. 08/02/15  Yes Fredia Sorrow, MD  lisinopril-hydrochlorothiazide (ZESTORETIC) 20-12.5 MG per tablet Take 1 tablet by mouth daily. 07/28/15  Yes Olga Millers, MD  naproxen sodium (ANAPROX) 220 MG tablet Take 220 mg by mouth daily as needed (pain, headache).    Yes Historical Provider, MD  cyclobenzaprine (FLEXERIL) 10 MG tablet Take 1 tablet (10 mg total) by mouth 2 (two) times daily as needed for muscle spasms. 08/04/15   Renne Musca, MD  ibuprofen (ADVIL,MOTRIN) 800 MG tablet Take 1 tablet (800 mg total) by mouth 3 (three) times daily. 08/04/15   Renne Musca, MD   BP 213/105 mmHg  Pulse 78  Temp(Src) 98.2 F (36.8 C) (Oral)  Resp 16  Ht 5\' 3"  (1.6 m)  Wt 177 lb (80.287 kg)  BMI 31.36 kg/m2  SpO2 98% Physical Exam  Constitutional: She is oriented to person, place, and time. She appears well-developed and well-nourished. No distress.  HENT:  Head: Normocephalic and atraumatic.  Eyes: EOM are normal.  Neck: Normal range of motion.  Cardiovascular:  Normal rate, regular rhythm and normal heart sounds.   No murmur heard. Pulmonary/Chest: Effort normal and breath sounds normal. No respiratory distress. She has no wheezes.  Abdominal: Soft. There is no tenderness.  Musculoskeletal: She exhibits no edema.  Neurological: She is alert and oriented to person, place, and time.  Skin: She is not diaphoretic.  Psychiatric: She has a normal mood and affect. Her behavior is  normal.    ED Course  Procedures (including critical care time) Labs Review Labs Reviewed  CBC - Abnormal; Notable for the following:    RBC 5.28 (*)    All other components within normal limits  BASIC METABOLIC PANEL  I-STAT TROPOININ, ED    Imaging Review Dg Chest 2 View  08/03/2015   CLINICAL DATA:  Sharp left-sided chest pain, left arm numbness for 3 days  EXAM: CHEST  2 VIEW  COMPARISON:  Chest x-ray of 08/02/2015  FINDINGS: No active infiltrate or effusion is seen. Mediastinal and hilar contours are unremarkable. The heart is within upper limits normal. No bony abnormality is seen.  IMPRESSION: No active cardiopulmonary disease.   Electronically Signed   By: Ivar Drape M.D.   On: 08/03/2015 16:48   Dg Chest 2 View  08/02/2015   CLINICAL DATA:  Chest pain, left-sided  EXAM: CHEST  2 VIEW  COMPARISON:  None.  FINDINGS: Normal heart size and mediastinal contours. No acute infiltrate or edema. No effusion or pneumothorax. No acute osseous findings.  IMPRESSION: Negative chest.   Electronically Signed   By: Monte Fantasia M.D.   On: 08/02/2015 12:13   Ct Head Wo Contrast  08/03/2015   CLINICAL DATA:  Hypertension and right upper extremity paresthesias  EXAM: CT HEAD WITHOUT CONTRAST  TECHNIQUE: Contiguous axial images were obtained from the base of the skull through the vertex without intravenous contrast.  COMPARISON:  None.  FINDINGS: There is no intracranial hemorrhage, mass or evidence of acute infarction. There is mild generalized atrophy. There is mild chronic microvascular ischemic change. There is no significant extra-axial fluid collection.  No acute intracranial findings are evident. The visible paranasal sinuses are clear.  IMPRESSION: No acute intracranial findings. There is mild generalized atrophy and chronic small vessel disease.   Electronically Signed   By: Andreas Newport M.D.   On: 08/03/2015 22:49   I have personally reviewed and evaluated these images and lab results  as part of my medical decision-making.   EKG Interpretation   Date/Time:  Wednesday August 03 2015 15:58:29 EDT Ventricular Rate:  75 PR Interval:  186 QRS Duration: 80 QT Interval:  394 QTC Calculation: 439 R Axis:   52 Text Interpretation:  Normal sinus rhythm Possible Left atrial enlargement  Borderline ECG No significant change since last tracing Confirmed by YAO   MD, DAVID ([id number]) on 08/03/2015 5:53:08 PM      MDM   Final diagnoses:  Chest pain, unspecified chest pain type     Patient is a 48 year old female with a history of hypertension that presents with 2 weeks of intermittent chest pain and left arm pain. Patient states this is always associated with position and movement. Patient was seen here yesterday for similar. Patient was recently started on hypertensive medicines and has been titrated up as they have not been very effective. Yesterday the patient's EKG and troponin were normal. The patient comes back because she was told if she has persistent pain to be dilated. Patient was told yesterday to return to treat her for  muscular skeletal pain however never prescribe her anything. On arrival to the ED today patient is afebrile and hemodynamically stable. On exam patient has no wheezing does not currently have any pain. Patient does have very reproducible pain to palpation of the left fourth and fifth rib. Today patient's EKG is normal sinus rhythm without evidence of ischemia. Patient's initial labs have a negative troponin and normal BMP and CBC. Given the patient's elevated blood pressure and she stated to the attending that she also had some vague left lower extremity numbness that was not currently present a CT head was performed that showed no ICH. Patient's chest x-ray is within normal limits. Patient's left arm pain is also very reproducible with tenderness to the left bicep. We will give the patient Toradol and reassess. Patient has significant improvement of symptoms and  is currently having no pain at this time. Patient's blood pressure is now 137/85. Patient was advised to follow-up with her PCP. Patient given a prescription for muscle relaxers.   Renne Musca, MD 08/04/15 [id number]  Wandra Arthurs, MD 08/04/15 2124

## 2015-08-04 ENCOUNTER — Ambulatory Visit
Admission: RE | Admit: 2015-08-04 | Discharge: 2015-08-04 | Disposition: A | Discharge status: Home / Self Care | Payer: BC Managed Care – PPO | Source: Ambulatory Visit | Attending: Internal Medicine | Admitting: Internal Medicine

## 2015-08-04 ENCOUNTER — Other Ambulatory Visit: Payer: Self-pay | Admitting: Internal Medicine

## 2015-08-04 DIAGNOSIS — Z0000 Encounter for general adult medical examination without abnormal findings: Secondary | ICD-10-CM

## 2015-08-04 MED ORDER — CYCLOBENZAPRINE HCL 10 MG PO TABS: 10.0000 mg | ORAL_TABLET | Freq: Two times a day (BID) | ORAL | Status: DC | PRN

## 2015-08-04 MED ORDER — IBUPROFEN 800 MG PO TABS: 800.0000 mg | ORAL_TABLET | Freq: Three times a day (TID) | ORAL | Status: DC

## 2015-08-04 NOTE — Discharge Instructions (Signed)

## 2015-08-12 ENCOUNTER — Ambulatory Visit (INDEPENDENT_AMBULATORY_CARE_PROVIDER_SITE_OTHER): Payer: BC Managed Care – PPO | Admitting: Internal Medicine

## 2015-08-12 ENCOUNTER — Encounter: Payer: Self-pay | Admitting: Internal Medicine

## 2015-08-12 ENCOUNTER — Other Ambulatory Visit (INDEPENDENT_AMBULATORY_CARE_PROVIDER_SITE_OTHER): Payer: BC Managed Care – PPO

## 2015-08-12 VITALS — BP 148/92 | HR 76 | Temp 98.3°F | Resp 16 | Ht 63.0 in | Wt 177.0 lb

## 2015-08-12 DIAGNOSIS — I10 Essential (primary) hypertension: Secondary | ICD-10-CM

## 2015-08-12 LAB — BASIC METABOLIC PANEL
BUN: 21 mg/dL (ref 6–23)
CHLORIDE: 103 meq/L (ref 96–112)
CO2: 29 meq/L (ref 19–32)
Calcium: 9.8 mg/dL (ref 8.4–10.5)
Creatinine, Ser: 0.87 mg/dL (ref 0.40–1.20)
GFR: 88.42 mL/min (ref >60.00)
GLUCOSE: 107 mg/dL — AB (ref 70–99)
Potassium: 3.8 mEq/L (ref 3.5–5.1)
SODIUM: 139 meq/L (ref 135–145)

## 2015-08-12 MED ORDER — LISINOPRIL-HYDROCHLOROTHIAZIDE 20-12.5 MG PO TABS: 2.0000 | ORAL_TABLET | Freq: Every day | ORAL | Status: DC

## 2015-08-12 NOTE — Progress Notes (Signed)
Pre visit review using our clinic review tool, if applicable. No additional management support is needed unless otherwise documented below in the visit note. 

## 2015-08-12 NOTE — Patient Instructions (Addendum)
We will have you keep taking the medicines as they are until the coreg (carvedilol) is gone.   For now: Take 1 lisinopril/hctz and 1 coreg (carvedilol) in the morning and 1 carvedilol at night  When the coreg (carvedilol) is gone: Take 2 pills lisinopril/hctz in the morning and no other pills.   Low-Sodium Eating Plan Sodium raises blood pressure and causes water to be held in the body. Getting less sodium from food will help lower your blood pressure, reduce any swelling, and protect your heart, liver, and kidneys. We get sodium by adding salt (sodium chloride) to food. Most of our sodium comes from canned, boxed, and frozen foods. Restaurant foods, fast foods, and pizza are also very high in sodium. Even if you take medicine to lower your blood pressure or to reduce fluid in your body, getting less sodium from your food is important. WHAT IS MY PLAN? Most people should limit their sodium intake to 3,000 mg a day. Your health care provider recommends that you limit your sodium intake to __________ a day.  WHAT DO I NEED TO KNOW ABOUT THIS EATING PLAN? For the low-sodium eating plan, you will follow these general guidelines:  Choose foods with a % Daily Value for sodium of less than 5% (as listed on the food label).   Use salt-free seasonings or herbs instead of table salt or sea salt.   Check with your health care provider or pharmacist before using salt substitutes.   Eat fresh foods.  Eat more vegetables and fruits.  Limit canned vegetables. If you do use them, rinse them well to decrease the sodium.   Limit cheese to 1 oz (28 g) per day.   Eat lower-sodium products, often labeled as "lower sodium" or "no salt added."  Avoid foods that contain monosodium glutamate (MSG). MSG is sometimes added to [place] food and some canned foods.  Check food labels (Nutrition Facts labels) on foods to learn how much sodium is in one serving.  Eat more home-cooked food and less  restaurant, buffet, and fast food.  When eating at a restaurant, ask that your food be prepared with less salt or none, if possible.  HOW DO I READ FOOD LABELS FOR SODIUM INFORMATION? The Nutrition Facts label lists the amount of sodium in one serving of the food. If you eat more than one serving, you must multiply the listed amount of sodium by the number of servings. Food labels may also identify foods as:  Sodium free--Less than 5 mg in a serving.  Very low sodium--35 mg or less in a serving.  Low sodium--140 mg or less in a serving.  Light in sodium--50% less sodium in a serving. For example, if a food that usually has 300 mg of sodium is changed to become light in sodium, it will have 150 mg of sodium.  Reduced sodium--25% less sodium in a serving. For example, if a food that usually has 400 mg of sodium is changed to reduced sodium, it will have 300 mg of sodium. WHAT FOODS CAN I EAT? Grains Low-sodium cereals, including oats, puffed wheat and rice, and shredded wheat cereals. Low-sodium crackers. Unsalted rice and pasta. Lower-sodium bread.  Vegetables Frozen or fresh vegetables. Low-sodium or reduced-sodium canned vegetables. Low-sodium or reduced-sodium tomato sauce and paste. Low-sodium or reduced-sodium tomato and vegetable juices.  Fruits Fresh, frozen, and canned fruit. Fruit juice.  Meat and Other Protein Products Low-sodium canned tuna and salmon. Fresh or frozen meat, poultry, seafood, and fish. Lamb.  Unsalted nuts. Dried beans, peas, and lentils without added salt. Unsalted canned beans. Homemade soups without salt. Eggs.  Dairy Milk. Soy milk. Ricotta cheese. Low-sodium or reduced-sodium cheeses. Yogurt.  Condiments Fresh and dried herbs and spices. Salt-free seasonings. Onion and garlic powders. Low-sodium varieties of mustard and ketchup. Lemon juice.  Fats and Oils Reduced-sodium salad dressings. Unsalted butter.  Other Unsalted popcorn and pretzels.    The items listed above may not be a complete list of recommended foods or beverages. Contact your dietitian for more options. WHAT FOODS ARE NOT RECOMMENDED? Grains Instant hot cereals. Bread stuffing, pancake, and biscuit mixes. Croutons. Seasoned rice or pasta mixes. Noodle soup cups. Boxed or frozen macaroni and cheese. Self-rising flour. Regular salted crackers. Vegetables Regular canned vegetables. Regular canned tomato sauce and paste. Regular tomato and vegetable juices. Frozen vegetables in sauces. Salted french fries. Olives. Angie Fava. Relishes. Sauerkraut. Salsa. Meat and Other Protein Products Salted, canned, smoked, spiced, or pickled meats, seafood, or fish. Bacon, ham, sausage, hot dogs, corned beef, chipped beef, and packaged luncheon meats. Salt pork. Jerky. Pickled herring. Anchovies, regular canned tuna, and sardines. Salted nuts. Dairy Processed cheese and cheese spreads. Cheese curds. Blue cheese and cottage cheese. Buttermilk.  Condiments Onion and garlic salt, seasoned salt, table salt, and sea salt. Canned and packaged gravies. Worcestershire sauce. Tartar sauce. Barbecue sauce. Teriyaki sauce. Soy sauce, including reduced sodium. Steak sauce. Fish sauce. Oyster sauce. Cocktail sauce. Horseradish. Regular ketchup and mustard. Meat flavorings and tenderizers. Bouillon cubes. Hot sauce. Tabasco sauce. Marinades. Taco seasonings. Relishes. Fats and Oils Regular salad dressings. Salted butter. Margarine. Ghee. Bacon fat.  Other Potato and tortilla chips. Corn chips and puffs. Salted popcorn and pretzels. Canned or dried soups. Pizza. Frozen entrees and pot pies.  The items listed above may not be a complete list of foods and beverages to avoid. Contact your dietitian for more information. Document Released: 05/11/2002 Document Revised: 11/24/2013 Document Reviewed: 09/23/2013 [facility] Patient Information 2015 [place], [place]. This information is not intended to replace  advice given to you by your health care provider. Make sure you discuss any questions you have with your health care provider.

## 2015-08-12 NOTE — Progress Notes (Signed)
   Subjective:    Patient ID: Judith Payne, female    DOB: [birth year], 49 y.o.   MRN: [id number]  HPI The patient is coming in for follow up of her blood pressure. She was started on lisinopril/hctz at last visit. Was able to start taking that with no problems. Still having high pressures and went to the ER ad they started her also on coreg BID. She is not remembering to take that one well as it is twice a day. She has had resolution of the headaches and symptoms she was having before on blood pressure medicine. Denies chest pains, SOB, headaches, nausea, confusion. She is working on less sodium in her diet. Her sister just had a stroke (at young 56s) and this has scared her some. She is also thinking about starting to work out some.   Review of Systems  Constitutional: Negative for fever, chills, activity change, appetite change, fatigue and unexpected weight change.  HENT: Negative.   Eyes: Negative.   Respiratory: Negative for cough, chest tightness, shortness of breath and wheezing.   Cardiovascular: Negative for chest pain, palpitations and leg swelling.  Gastrointestinal: Negative for nausea, abdominal pain, diarrhea, constipation and abdominal distention.  Musculoskeletal: Negative.   Skin: Negative.   Neurological: Negative for dizziness, weakness, light-headedness and headaches.  Psychiatric/Behavioral: Negative.       Objective:   Physical Exam  Constitutional: She is oriented to person, place, and time. She appears well-developed and well-nourished.  HENT:  Head: Normocephalic and atraumatic.  Eyes: EOM are normal. Pupils are equal, round, and reactive to light.  Neck: Normal range of motion.  Cardiovascular: Normal rate and regular rhythm.   Pulmonary/Chest: Effort normal and breath sounds normal. No respiratory distress. She has no wheezes. She has no rales.  Abdominal: Soft. Bowel sounds are normal. She exhibits no distension. There is no tenderness. There is no rebound.    Musculoskeletal: She exhibits no edema.  Neurological: She is alert and oriented to person, place, and time. Coordination normal.  Skin: Skin is warm and dry.  Psychiatric: She has a normal mood and affect.   Filed Vitals:   08/12/15 0853 08/12/15 0933  BP: 152/100 148/92  Pulse: 76   Temp: 98.3 F (36.8 C)   TempSrc: Oral   Resp: 16   Height: 5\' 3"  (1.6 m)   Weight: 177 lb (80.287 kg)   SpO2: 98%       Assessment & Plan:

## 2015-08-12 NOTE — Assessment & Plan Note (Signed)
Will simplify regimen to lisinopril/hctz 2 pills daily. Given instructions on how to take which she was able to repeat to me. Talked to her about sodium content of food and information provided. Encouraged her to start exercising for her health.  BP still mildly elevated and increasing therapy today. Checking BMP today and adjust as needed. Back in 1 month for follow up of her pressures.

## 2016-07-19 ENCOUNTER — Telehealth: Payer: Self-pay | Admitting: Emergency Medicine

## 2016-07-19 DIAGNOSIS — Z1211 Encounter for screening for malignant neoplasm of colon: Secondary | ICD-10-CM

## 2016-07-19 NOTE — Telephone Encounter (Signed)
Order placed

## 2016-07-19 NOTE — Telephone Encounter (Signed)
Pt called and wants to know if she can get a referral to see Dr Suezanne Cheshire for a colonoscopy. His number is [phone]. Please follow up thanks.

## 2016-10-12 ENCOUNTER — Other Ambulatory Visit: Payer: Self-pay | Admitting: Internal Medicine

## 2017-04-24 ENCOUNTER — Other Ambulatory Visit: Payer: Self-pay | Admitting: Internal Medicine

## 2017-04-24 DIAGNOSIS — Z1231 Encounter for screening mammogram for malignant neoplasm of breast: Secondary | ICD-10-CM

## 2017-04-26 ENCOUNTER — Ambulatory Visit
Admission: RE | Admit: 2017-04-26 | Discharge: 2017-04-26 | Disposition: A | Discharge status: Home / Self Care | Payer: BC Managed Care – PPO | Source: Ambulatory Visit | Attending: Internal Medicine | Admitting: Internal Medicine

## 2017-04-26 DIAGNOSIS — Z1231 Encounter for screening mammogram for malignant neoplasm of breast: Secondary | ICD-10-CM

## 2017-11-02 ENCOUNTER — Other Ambulatory Visit: Payer: Self-pay | Admitting: Internal Medicine

## 2017-11-14 ENCOUNTER — Other Ambulatory Visit (INDEPENDENT_AMBULATORY_CARE_PROVIDER_SITE_OTHER): Payer: BC Managed Care – PPO

## 2017-11-14 ENCOUNTER — Encounter: Payer: Self-pay | Admitting: Internal Medicine

## 2017-11-14 ENCOUNTER — Ambulatory Visit: Payer: BC Managed Care – PPO | Admitting: Internal Medicine

## 2017-11-14 VITALS — BP 132/86 | HR 98 | Temp 97.9°F | Ht 63.0 in | Wt 178.0 lb

## 2017-11-14 DIAGNOSIS — I10 Essential (primary) hypertension: Secondary | ICD-10-CM | POA: Diagnosis not present

## 2017-11-14 DIAGNOSIS — R202 Paresthesia of skin: Secondary | ICD-10-CM

## 2017-11-14 LAB — CBC
HEMATOCRIT: 44.1 % (ref 36.0–46.0)
Hemoglobin: 14.5 g/dL (ref 12.0–15.0)
MCHC: 32.8 g/dL (ref 30.0–36.0)
MCV: 86 fl (ref 78.0–100.0)
Platelets: 286 10*3/uL (ref 150.0–400.0)
RBC: 5.12 Mil/uL — AB (ref 3.87–5.11)
RDW: 14.8 % (ref 11.5–15.5)
WBC: 8.6 10*3/uL (ref 4.0–10.5)

## 2017-11-14 LAB — COMPREHENSIVE METABOLIC PANEL
ALBUMIN: 4.5 g/dL (ref 3.5–5.2)
ALK PHOS: 71 U/L (ref 39–117)
ALT: 29 U/L (ref 0–35)
AST: 19 U/L (ref 0–37)
BILIRUBIN TOTAL: 0.3 mg/dL (ref 0.2–1.2)
BUN: 29 mg/dL — ABNORMAL HIGH (ref 6–23)
CALCIUM: 9.9 mg/dL (ref 8.4–10.5)
CO2: 30 meq/L (ref 19–32)
CREATININE: 1.03 mg/dL (ref 0.40–1.20)
Chloride: 106 mEq/L (ref 96–112)
GFR: 72.12 mL/min (ref >60.00)
Glucose, Bld: 87 mg/dL (ref 70–99)
Potassium: 3.7 mEq/L (ref 3.5–5.1)
Sodium: 143 mEq/L (ref 135–145)
Total Protein: 7.8 g/dL (ref 6.0–8.3)

## 2017-11-14 LAB — HEMOGLOBIN A1C: HEMOGLOBIN A1C: 6.1 % (ref 4.6–6.5)

## 2017-11-14 LAB — LIPID PANEL
CHOL/HDL RATIO: 5
CHOLESTEROL: 208 mg/dL — AB (ref 0–200)
HDL: 39.9 mg/dL (ref >39.00)
NonHDL: 168.48
TRIGLYCERIDES: 381 mg/dL — AB (ref 0.0–149.0)
VLDL: 76.2 mg/dL — AB (ref 0.0–40.0)

## 2017-11-14 LAB — T4, FREE: FREE T4: 0.64 ng/dL (ref 0.60–1.60)

## 2017-11-14 LAB — LDL CHOLESTEROL, DIRECT: LDL DIRECT: 134 mg/dL

## 2017-11-14 LAB — VITAMIN D 25 HYDROXY (VIT D DEFICIENCY, FRACTURES): VITD: 23.25 ng/mL — ABNORMAL LOW (ref 30.00–100.00)

## 2017-11-14 LAB — TSH: TSH: 0.43 u[IU]/mL (ref 0.35–4.50)

## 2017-11-14 LAB — VITAMIN B12: Vitamin B-12: 684 pg/mL (ref 211–911)

## 2017-11-14 NOTE — Patient Instructions (Signed)
We are checking the labs today and will call you back about the results.   Come back for a physical in the new year.

## 2017-11-14 NOTE — Assessment & Plan Note (Signed)
Has not had a visit in a long time. Needs CMP today before any refills. Asked to make wellness visit within the next several months as we are not able to address this problem adequately today.

## 2017-11-14 NOTE — Progress Notes (Signed)
   Subjective:    Patient ID: Judith Payne, female    DOB: Apr 11, 1966, 51 y.o.   MRN: [id number]  HPI The patient is a 51 YO female coming in for abnormal sensation. Going on for about 2-3 days. Getting burning in her shins/feet/legs which comes and feels like it is on the inside. She gets this worse at night time. She denies change in urination or thirst. She denies change to medication. No labs in some time. Denies abdominal pain or chest pains or SOB. No headaches. Some abnormal sensation in her feet but not quite numb.   Review of Systems  Constitutional: Negative.   HENT: Negative.   Eyes: Negative.   Respiratory: Negative for cough, chest tightness and shortness of breath.   Cardiovascular: Negative for chest pain, palpitations and leg swelling.  Gastrointestinal: Negative for abdominal distention, abdominal pain, constipation, diarrhea, nausea and vomiting.  Musculoskeletal: Negative.        Some burning  Skin: Negative.   Neurological: Positive for numbness. Negative for dizziness, seizures, syncope, facial asymmetry, weakness and light-headedness.  Psychiatric/Behavioral: Negative.       Objective:   Physical Exam  Constitutional: She is oriented to person, place, and time. She appears well-developed and well-nourished.  HENT:  Head: Normocephalic and atraumatic.  Eyes: EOM are normal.  Neck: Normal range of motion.  Cardiovascular: Normal rate and regular rhythm.  Pulmonary/Chest: Effort normal and breath sounds normal. No respiratory distress. She has no wheezes. She has no rales.  Abdominal: Soft. Bowel sounds are normal. She exhibits no distension. There is no tenderness. There is no rebound.  Musculoskeletal: She exhibits no edema.  Neurological: She is alert and oriented to person, place, and time. Coordination normal.  Skin: Skin is warm and dry.  Psychiatric: She has a normal mood and affect.   Vitals:   11/14/17 1517  BP: 132/86  Pulse: 98  Temp: 97.9 F  (36.6 C)  TempSrc: Oral  SpO2: 98%  Weight: 178 lb (80.7 kg)  Height: 5\' 3"  (1.6 m)      Assessment & Plan:

## 2017-11-14 NOTE — Assessment & Plan Note (Signed)
Possibly neuropathy with burning and abnormal sensation in feet. Checking HgA1c, CMP, CBC, TSH, vitamin D, vitamin B12.

## 2017-12-14 ENCOUNTER — Other Ambulatory Visit: Payer: Self-pay | Admitting: Internal Medicine

## 2018-03-18 ENCOUNTER — Other Ambulatory Visit: Payer: Self-pay | Admitting: Internal Medicine

## 2018-03-18 DIAGNOSIS — Z1231 Encounter for screening mammogram for malignant neoplasm of breast: Secondary | ICD-10-CM

## 2018-04-29 ENCOUNTER — Ambulatory Visit: Payer: BC Managed Care – PPO

## 2018-05-28 ENCOUNTER — Ambulatory Visit: Payer: BC Managed Care – PPO

## 2018-05-30 ENCOUNTER — Ambulatory Visit
Admission: RE | Admit: 2018-05-30 | Discharge: 2018-05-30 | Disposition: A | Discharge status: Home / Self Care | Payer: BC Managed Care – PPO | Source: Ambulatory Visit | Attending: Internal Medicine | Admitting: Internal Medicine

## 2018-05-30 DIAGNOSIS — Z1231 Encounter for screening mammogram for malignant neoplasm of breast: Secondary | ICD-10-CM

## 2019-05-04 ENCOUNTER — Other Ambulatory Visit: Payer: Self-pay | Admitting: Internal Medicine

## 2019-05-04 DIAGNOSIS — Z1231 Encounter for screening mammogram for malignant neoplasm of breast: Secondary | ICD-10-CM

## 2019-06-19 ENCOUNTER — Ambulatory Visit: Payer: BC Managed Care – PPO

## 2019-07-28 ENCOUNTER — Ambulatory Visit: Payer: BC Managed Care – PPO | Admitting: Internal Medicine

## 2019-08-04 ENCOUNTER — Ambulatory Visit (INDEPENDENT_AMBULATORY_CARE_PROVIDER_SITE_OTHER): Payer: BC Managed Care – PPO | Admitting: Internal Medicine

## 2019-08-04 ENCOUNTER — Other Ambulatory Visit: Payer: Self-pay

## 2019-08-04 ENCOUNTER — Other Ambulatory Visit: Payer: Self-pay | Admitting: Internal Medicine

## 2019-08-04 ENCOUNTER — Encounter: Payer: Self-pay | Admitting: Internal Medicine

## 2019-08-04 ENCOUNTER — Other Ambulatory Visit (INDEPENDENT_AMBULATORY_CARE_PROVIDER_SITE_OTHER): Payer: BC Managed Care – PPO

## 2019-08-04 VITALS — BP 138/108 | HR 102 | Temp 98.6°F | Ht 63.0 in | Wt 181.0 lb

## 2019-08-04 DIAGNOSIS — Z720 Tobacco use: Secondary | ICD-10-CM | POA: Insufficient documentation

## 2019-08-04 DIAGNOSIS — Z23 Encounter for immunization: Secondary | ICD-10-CM | POA: Diagnosis not present

## 2019-08-04 DIAGNOSIS — Z0001 Encounter for general adult medical examination with abnormal findings: Secondary | ICD-10-CM | POA: Insufficient documentation

## 2019-08-04 DIAGNOSIS — I10 Essential (primary) hypertension: Secondary | ICD-10-CM

## 2019-08-04 DIAGNOSIS — Z0000 Encounter for general adult medical examination without abnormal findings: Secondary | ICD-10-CM | POA: Diagnosis not present

## 2019-08-04 DIAGNOSIS — R7303 Prediabetes: Secondary | ICD-10-CM

## 2019-08-04 LAB — COMPREHENSIVE METABOLIC PANEL
ALT: 25 U/L (ref 0–35)
AST: 20 U/L (ref 0–37)
Albumin: 4.3 g/dL (ref 3.5–5.2)
Alkaline Phosphatase: 78 U/L (ref 39–117)
BUN: 16 mg/dL (ref 6–23)
CO2: 30 mEq/L (ref 19–32)
Calcium: 10.3 mg/dL (ref 8.4–10.5)
Chloride: 104 mEq/L (ref 96–112)
Creatinine, Ser: 0.93 mg/dL (ref 0.40–1.20)
GFR: 75.85 mL/min (ref >60.00)
Glucose, Bld: 96 mg/dL (ref 70–99)
Potassium: 4 mEq/L (ref 3.5–5.1)
Sodium: 142 mEq/L (ref 135–145)
Total Bilirubin: 0.5 mg/dL (ref 0.2–1.2)
Total Protein: 7.7 g/dL (ref 6.0–8.3)

## 2019-08-04 LAB — CBC
HCT: 45.1 % (ref 36.0–46.0)
Hemoglobin: 14.9 g/dL (ref 12.0–15.0)
MCHC: 33.1 g/dL (ref 30.0–36.0)
MCV: 85.2 fl (ref 78.0–100.0)
Platelets: 295 10*3/uL (ref 150.0–400.0)
RBC: 5.29 Mil/uL — ABNORMAL HIGH (ref 3.87–5.11)
RDW: 14.7 % (ref 11.5–15.5)
WBC: 8.9 10*3/uL (ref 4.0–10.5)

## 2019-08-04 LAB — LDL CHOLESTEROL, DIRECT: Direct LDL: 135 mg/dL

## 2019-08-04 LAB — LIPID PANEL
Cholesterol: 216 mg/dL — ABNORMAL HIGH (ref 0–200)
HDL: 47.6 mg/dL (ref >39.00)
NonHDL: 168.63
Total CHOL/HDL Ratio: 5
Triglycerides: 238 mg/dL — ABNORMAL HIGH (ref 0.0–149.0)
VLDL: 47.6 mg/dL — ABNORMAL HIGH (ref 0.0–40.0)

## 2019-08-04 LAB — HEMOGLOBIN A1C: Hgb A1c MFr Bld: 6.1 % (ref 4.6–6.5)

## 2019-08-04 LAB — VITAMIN B12: Vitamin B-12: 562 pg/mL (ref 211–911)

## 2019-08-04 LAB — VITAMIN D 25 HYDROXY (VIT D DEFICIENCY, FRACTURES): VITD: 12.9 ng/mL — ABNORMAL LOW (ref 30.00–100.00)

## 2019-08-04 MED ORDER — VITAMIN D (ERGOCALCIFEROL) 1.25 MG (50000 UNIT) PO CAPS: 50000.0000 [IU] | ORAL_CAPSULE | ORAL | 0 refills | Status: DC

## 2019-08-04 MED ORDER — LISINOPRIL-HYDROCHLOROTHIAZIDE 20-12.5 MG PO TABS: 2.0000 | ORAL_TABLET | Freq: Every day | ORAL | 0 refills | Status: DC

## 2019-08-04 NOTE — Assessment & Plan Note (Signed)
She is quit 2-3 weeks now and encouraged lifelong cessation. When back in 1 month if still quit will update smoking history.

## 2019-08-04 NOTE — Assessment & Plan Note (Signed)
BP not at goal, off meds. Will check CMP and if okay reorder lisinopril/hctz 2 pills 40/25 mg daily. EKG done without acute changes. Needs repeat visit in 1 month with labs.

## 2019-08-04 NOTE — Patient Instructions (Addendum)
Your EKG of the heart is normal. We are checking lab work and as soon as we get the results we will send in the blood pressure medicine.   We need to see you back in 1 month on the blood pressure medicine to recheck labs and make sure it is working.   Health Maintenance, Female Adopting a healthy lifestyle and getting preventive care are important in promoting health and wellness. Ask your health care provider about:  The right schedule for you to have regular tests and exams.  Things you can do on your own to prevent diseases and keep yourself healthy. What should I know about diet, weight, and exercise? Eat a healthy diet   Eat a diet that includes plenty of vegetables, fruits, low-fat dairy products, and lean protein.  Do not eat a lot of foods that are high in solid fats, added sugars, or sodium. Maintain a healthy weight Body mass index (BMI) is used to identify weight problems. It estimates body fat based on height and weight. Your health care provider can help determine your BMI and help you achieve or maintain a healthy weight. Get regular exercise Get regular exercise. This is one of the most important things you can do for your health. Most adults should:  Exercise for at least 150 minutes each week. The exercise should increase your heart rate and make you sweat (moderate-intensity exercise).  Do strengthening exercises at least twice a week. This is in addition to the moderate-intensity exercise.  Spend less time sitting. Even light physical activity can be beneficial. Watch cholesterol and blood lipids Have your blood tested for lipids and cholesterol at 54 years of age, then have this test every 5 years. Have your cholesterol levels checked more often if:  Your lipid or cholesterol levels are high.  You are older than 54 years of age.  You are at high risk for heart disease. What should I know about cancer screening? Depending on your health history and family  history, you may need to have cancer screening at various ages. This may include screening for:  Breast cancer.  Cervical cancer.  Colorectal cancer.  Skin cancer.  Lung cancer. What should I know about heart disease, diabetes, and high blood pressure? Blood pressure and heart disease  High blood pressure causes heart disease and increases the risk of stroke. This is more likely to develop in people who have high blood pressure readings, are of African descent, or are overweight.  Have your blood pressure checked: ? Every 3-5 years if you are 34-27 years of age. ? Every year if you are 41 years old or older. Diabetes Have regular diabetes screenings. This checks your fasting blood sugar level. Have the screening done:  Once every three years after age 9 if you are at a normal weight and have a low risk for diabetes.  More often and at a younger age if you are overweight or have a high risk for diabetes. What should I know about preventing infection? Hepatitis B If you have a higher risk for hepatitis B, you should be screened for this virus. Talk with your health care provider to find out if you are at risk for hepatitis B infection. Hepatitis C Testing is recommended for:  Everyone born from [birth year] through 1966.  Anyone with known risk factors for hepatitis C. Sexually transmitted infections (STIs)  Get screened for STIs, including gonorrhea and chlamydia, if: ? You are sexually active and are younger than 54 years  of age. ? You are older than 54 years of age and your health care provider tells you that you are at risk for this type of infection. ? Your sexual activity has changed since you were last screened, and you are at increased risk for chlamydia or gonorrhea. Ask your health care provider if you are at risk.  Ask your health care provider about whether you are at high risk for HIV. Your health care provider may recommend a prescription medicine to help prevent HIV  infection. If you choose to take medicine to prevent HIV, you should first get tested for HIV. You should then be tested every 3 months for as long as you are taking the medicine. Pregnancy  If you are about to stop having your period (premenopausal) and you may become pregnant, seek counseling before you get pregnant.  Take 400 to 800 micrograms (mcg) of folic acid every day if you become pregnant.  Ask for birth control (contraception) if you want to prevent pregnancy. Osteoporosis and menopause Osteoporosis is a disease in which the bones lose minerals and strength with aging. This can result in bone fractures. If you are 24 years old or older, or if you are at risk for osteoporosis and fractures, ask your health care provider if you should:  Be screened for bone loss.  Take a calcium or vitamin D supplement to lower your risk of fractures.  Be given hormone replacement therapy (HRT) to treat symptoms of menopause. Follow these instructions at home: Lifestyle  Do not use any products that contain nicotine or tobacco, such as cigarettes, e-cigarettes, and chewing tobacco. If you need help quitting, ask your health care provider.  Do not use street drugs.  Do not share needles.  Ask your health care provider for help if you need support or information about quitting drugs. Alcohol use  Do not drink alcohol if: ? Your health care provider tells you not to drink. ? You are pregnant, may be pregnant, or are planning to become pregnant.  If you drink alcohol: ? Limit how much you use to 0-1 drink a day. ? Limit intake if you are breastfeeding.  Be aware of how much alcohol is in your drink. In the [place] one drink equals one 12 oz bottle of beer (355 mL), one 5 oz glass of wine (148 mL), or one 1 oz glass of hard liquor (44 mL). General instructions  Schedule regular health, dental, and eye exams.  Stay current with your vaccines.  Tell your health care provider if: ? You  often feel depressed. ? You have ever been abused or do not feel safe at home. Summary  Adopting a healthy lifestyle and getting preventive care are important in promoting health and wellness.  Follow your health care provider's instructions about healthy diet, exercising, and getting tested or screened for diseases.  Follow your health care provider's instructions on monitoring your cholesterol and blood pressure. This information is not intended to replace advice given to you by your health care provider. Make sure you discuss any questions you have with your health care provider. Document Released: 06/04/2011 Document Revised: 11/12/2018 Document Reviewed: 11/12/2018 Elsevier Patient Education  2020 [employer].

## 2019-08-04 NOTE — Assessment & Plan Note (Signed)
Prior HgA1c with pre-diabetes and checking today. Adjust as needed.

## 2019-08-04 NOTE — Progress Notes (Signed)
   Subjective:   Patient ID: Judith Payne, female    DOB: [birth year], 53 y.o.   MRN: [id number]  HPI The patient is a 53 YO female coming in for physical. Not seen in 2 years and out of meds for several weeks (although per our records should be out since January 2020).   PMH, [facility], social history reviewed and updated  Review of Systems  Constitutional: Negative.   HENT: Negative.   Eyes: Negative.   Respiratory: Negative for cough, chest tightness and shortness of breath.   Cardiovascular: Negative for chest pain, palpitations and leg swelling.  Gastrointestinal: Negative for abdominal distention, abdominal pain, constipation, diarrhea, nausea and vomiting.  Musculoskeletal: Negative.   Skin: Negative.   Neurological: Negative.   Psychiatric/Behavioral: Negative.     Objective:  Physical Exam Constitutional:      Appearance: She is well-developed.  HENT:     Head: Normocephalic and atraumatic.  Neck:     Musculoskeletal: Normal range of motion.  Cardiovascular:     Rate and Rhythm: Normal rate and regular rhythm.  Pulmonary:     Effort: Pulmonary effort is normal. No respiratory distress.     Breath sounds: Normal breath sounds. No wheezing or rales.  Abdominal:     General: Bowel sounds are normal. There is no distension.     Palpations: Abdomen is soft.     Tenderness: There is no abdominal tenderness. There is no rebound.  Skin:    General: Skin is warm and dry.  Neurological:     Mental Status: She is alert and oriented to person, place, and time.     Coordination: Coordination normal.     Vitals:   08/04/19 1007 08/04/19 1114  BP: (!) 140/110 (!) 138/108  Pulse: (!) 102   Temp: 98.6 F (37 C)   TempSrc: Oral   SpO2: 95%   Weight: 181 lb (82.1 kg)   Height: 5\' 3"  (1.6 m)    EKG: Rate 75, axis normal, interval normal, sinus, no st or t wave changes, no significant change when compared to 2016  Assessment & Plan:  Flu shot given at visit

## 2019-08-04 NOTE — Assessment & Plan Note (Signed)
Flu shot given. Shingrix counseled. Tetanus up to date. Colonoscopy she states done not sure year getting records. Mammogram up to date, pap smear due, declines today. Counseled about sun safety and mole surveillance. Counseled about the dangers of distracted driving. Given 10 year screening recommendations.

## 2019-08-21 ENCOUNTER — Telehealth: Payer: Self-pay

## 2019-08-21 DIAGNOSIS — D219 Benign neoplasm of connective and other soft tissue, unspecified: Secondary | ICD-10-CM

## 2019-08-21 NOTE — Telephone Encounter (Signed)
Placed.

## 2019-08-21 NOTE — Telephone Encounter (Signed)
Noted  

## 2019-08-21 NOTE — Telephone Encounter (Signed)
Copied from [place] [id number]. Topic: Referral - Request for Referral >> Aug 21, 2019  9:38 AM Alanda Slim E wrote: Has patient seen PCP for this complaint? No  *If NO, is insurance requiring patient see PCP for this issue before PCP can refer them? Referral for which specialty: OBGYN Preferred provider/office:  Reason for referral: Fibroids issues

## 2019-08-21 NOTE — Telephone Encounter (Signed)
Patient informed she does not need a referral and gave number for [facility] to call

## 2019-08-21 NOTE — Telephone Encounter (Signed)
Copied from [place] [id number]. Topic: Referral - Request for Referral >> Aug 21, 2019 10:32 AM Leward Quan A wrote: Has patient seen PCP for this complaint? Yes.   *If NO, is insurance requiring patient see PCP for this issue before PCP can refer them? Referral for which specialty: GYN Preferred provider/office: Next door to [facility] Reason for referral: [facility].  Fax# [phone] for referral

## 2019-08-21 NOTE — Telephone Encounter (Signed)
Patient insurance needs for her to have a referral

## 2019-09-03 ENCOUNTER — Ambulatory Visit: Payer: BC Managed Care – PPO | Admitting: Internal Medicine

## 2019-09-24 ENCOUNTER — Encounter: Payer: Self-pay | Admitting: Obstetrics & Gynecology

## 2019-09-24 ENCOUNTER — Ambulatory Visit (INDEPENDENT_AMBULATORY_CARE_PROVIDER_SITE_OTHER): Payer: BC Managed Care – PPO | Admitting: Obstetrics & Gynecology

## 2019-09-24 ENCOUNTER — Other Ambulatory Visit: Payer: Self-pay

## 2019-09-24 VITALS — BP 164/106 | HR 69 | Ht 63.0 in | Wt 185.1 lb

## 2019-09-24 DIAGNOSIS — Z124 Encounter for screening for malignant neoplasm of cervix: Secondary | ICD-10-CM | POA: Diagnosis not present

## 2019-09-24 DIAGNOSIS — D259 Leiomyoma of uterus, unspecified: Secondary | ICD-10-CM

## 2019-09-24 DIAGNOSIS — Z1151 Encounter for screening for human papillomavirus (HPV): Secondary | ICD-10-CM | POA: Diagnosis not present

## 2019-09-24 NOTE — Patient Instructions (Signed)
Uterine Fibroids  Uterine fibroids are lumps of tissue (tumors) in your womb (uterus). They are not cancer (are benign). Most women with this condition do not need treatment. Sometimes fibroids can affect your ability to have children (your fertility). If that happens, you may need surgery to take out the fibroids. Follow these instructions at home:  Take over-the-counter and prescription medicines only as told by your doctor. Your doctor may suggest NSAIDs (such as aspirin or ibuprofen) to help with pain.  Ask your doctor if you should: ? Take iron pills. ? Eat more foods that have iron in them, such as dark green, leafy vegetables.  If directed, apply heat to your back or belly to reduce pain. Use the heat source that your doctor recommends, such as a moist heat pack or a heating pad. ? Put a towel between your skin and the heat source. ? Leave the heat on for 20-30 minutes. ? Remove the heat if your skin turns bright red. This is especially important if you are unable to feel pain, heat, or cold. You may have a greater risk of getting burned.  Pay close attention to your period (menstrual) cycles. Tell your doctor about any changes, such as: ? A heavier blood flow than usual. ? Needing to use more pads or tampons than normal. ? A change in how many days your period lasts. ? A change in symptoms that come with your period, such as cramps or back pain.  Keep all follow-up visits as told by your doctor. This is important. Your doctor may need to watch your fibroids over time for any changes. Contact a doctor if you:  Have pain that does not get better with medicine or heat, such as pain or cramps in: ? Your back. ? The area between your hip bones (pelvic area). ? Your belly.  Have new bleeding between your periods.  Have more bleeding during or between your periods.  Feel very tired or weak.  Feel light-headed. Get help right away if you:  Pass out (faint).  Have pain in the  area between your hip bones that suddenly gets worse.  Have bleeding that soaks a tampon or pad in 30 minutes or less. Summary  Uterine fibroids are lumps of tissue (tumors) in your womb (uterus). They are not cancer.  The only treatment that most women need is taking aspirin or ibuprofen for pain.  Contact a doctor if you have pain or cramps that do not get better with medicine.  Make sure you know what symptoms you should get help for right away. This information is not intended to replace advice given to you by your health care provider. Make sure you discuss any questions you have with your health care provider. Document Released: 12/22/2010 Document Revised: 11/01/2017 Document Reviewed: 10/15/2017 Elsevier Patient Education  2020 Elsevier Inc.  

## 2019-09-24 NOTE — Progress Notes (Signed)
Patient ID: Judith Payne, female   DOB: [birth year], 53 y.o.   MRN: [id number]  Chief Complaint  Patient presents with  . Gynecologic Exam   H/o fibroid uterus HPI Judith Payne is a 53 y.o. female.  [id number], No LMP recorded. Patient is postmenopausal. She was referred for evaluation with h/o uterine fibroid. No bleeding, she had an episode of pelvic pain that resolved 2-3 months ago.  HPI  Past Medical History:  Diagnosis Date  . Hypertension     Past Surgical History:  Procedure Laterality Date  . NO PAST SURGERIES      Family History  Problem Relation Age of Onset  . Hypertension Mother   . Diabetes Mother   . Cancer Father   . Stroke Sister 88    Social History Social History   Tobacco Use  . Smoking status: Current Some Day Smoker  . Smokeless tobacco: Current User  Substance Use Topics  . Alcohol use: Yes    Alcohol/week: 0.0 standard drinks  . Drug use: Never    No Known Allergies  Current Outpatient Medications  Medication Sig Dispense Refill  . lisinopril-hydrochlorothiazide (ZESTORETIC) 20-12.5 MG tablet Take 2 tablets by mouth daily. 180 tablet 0  . Vitamin D, Ergocalciferol, (DRISDOL) 1.25 MG (50000 UT) CAPS capsule Take 1 capsule (50,000 Units total) by mouth every 7 (seven) days. 12 capsule 0   No current facility-administered medications for this visit.     Review of Systems Review of Systems  Constitutional: Negative.   HENT: Negative.   Respiratory: Negative.   Gastrointestinal: Negative.   Genitourinary: Positive for frequency, pelvic pain, urgency and vaginal bleeding. Negative for vaginal discharge and vaginal pain.    Blood pressure (!) 164/106, pulse 69, height 5\' 3"  (1.6 m), weight 185 lb 1.6 oz (84 kg).  Physical Exam Physical Exam Vitals signs and nursing note reviewed.  Cardiovascular:     Rate and Rhythm: Normal rate.  Pulmonary:     Effort: Pulmonary effort is normal.  Abdominal:     Palpations: Abdomen is soft. There  is no mass.  Genitourinary:    General: Normal vulva.     Comments: Cervix normal, pap done. Uterus  Enlarged posteriorly, not tender no adnexal mass Neurological:     Mental Status: She is alert.     Data Reviewed FINDINGS CLINICAL NOTE:  HEAVY VAGINAL BLEEDING AND BLEEDING DURING MENSTRUAL CYCLE. ULTRASOUND OF PELVIS COMPLETE, ULTRASOUND TRANSVAGINAL NON-OB: SCANS WERE PERFORMED BOTH TRANSABDOMINALLY AND TRANSVAGINALLY.  THE UTERUS IS RETROVERTED AND MEASURES 9.8 CM. IN LENGTH, 6 CM. IN AP DIAMETER AND 7 CM. IN WIDTH.  THE ENDOMETRIAL STRIPE IS 12.2 MM. IN WIDTH AND IS WITHIN NORMAL LIMITS IN THIS PATIENT WHOSE LMP WAS TWO WEEKS AGO. IN THE ANTERIOR BODY OF THE LEFT SIDE OF THE UTERUS EXTENDING TOWARD THE FUNDUS THERE IS A MYOMETRIAL MASS THAT MEASURES 5.5 X 5.9 X 6.7 CM.  THERE IS A QUESTION OF ANOTHER SMALL MYOMETRIAL MASS IN THE MID BODY OF THE UTERUS. THE RIGHT OVARY IS 3.8 X 1.9 X 2.9 CM. AND CONTAINS AN ECHO-CLEAR FOLLICLE THAT IS 1.9 CM.  THERE IS A SMALL AMOUNT OF FREE FLUID IN THE PELVIS ADJACENT TO THE RIGHT OVARY. THE LEFT OVARY MEASURES 2.4 X 1.4 X .9 CM. AND IS UNREMARKABLE. IMPRESSION 1.  6.7 CM. MYOMETRIAL MASS IN THE ANTERIOR BODY AND FUNDAL REGION OF THE UTERUS THAT IS CONSISTENT WITH A FIBROID. 2.  THE OVARIES AND ADNEXAE ARE WITHIN NORMAL LIMITS.  Assessment Possible pelvic  symptoms due to uterine enlargement with fibroid  Plan Pelvic ultrasound RTC to review result    Judith Payne 09/24/2019, 11:26 AM

## 2019-09-25 LAB — CYTOLOGY - PAP
Comment: NEGATIVE
Diagnosis: NEGATIVE
High risk HPV: NEGATIVE

## 2019-10-01 ENCOUNTER — Ambulatory Visit (HOSPITAL_COMMUNITY): Admission: RE | Admit: 2019-10-01 | Payer: BC Managed Care – PPO | Source: Ambulatory Visit

## 2019-10-20 ENCOUNTER — Other Ambulatory Visit: Payer: Self-pay | Admitting: Internal Medicine

## 2019-10-21 ENCOUNTER — Ambulatory Visit (HOSPITAL_COMMUNITY)
Admission: RE | Admit: 2019-10-21 | Discharge: 2019-10-21 | Disposition: A | Discharge status: Home / Self Care | Payer: BC Managed Care – PPO | Source: Ambulatory Visit | Attending: Obstetrics & Gynecology | Admitting: Obstetrics & Gynecology

## 2019-10-21 ENCOUNTER — Encounter (HOSPITAL_COMMUNITY): Payer: Self-pay

## 2019-10-21 ENCOUNTER — Other Ambulatory Visit: Payer: Self-pay

## 2019-10-21 DIAGNOSIS — D259 Leiomyoma of uterus, unspecified: Secondary | ICD-10-CM | POA: Insufficient documentation

## 2019-10-26 ENCOUNTER — Telehealth: Payer: Self-pay

## 2019-10-26 NOTE — Telephone Encounter (Addendum)
-----   Message from Woodroe Mode, MD sent at 10/23/2019 12:02 PM EST ----- Will f/u at next visit  LM for pt that her [place] confirmed what she already knew and that the provider will discuss results at her appt scheduled on 11/09/19 @ 0855.  If she has questions to please call the office.    Mel Almond, RN 10/26/19

## 2019-11-02 ENCOUNTER — Telehealth: Payer: Self-pay | Admitting: Obstetrics & Gynecology

## 2019-11-02 NOTE — Telephone Encounter (Signed)
Attempted to contact patient to let her know that her appointment on 12/7 @ 8:55 is a virtual appointment. No answer, left voicemail w/ this information. Patient instructed this change is due to the increase number of COVID cases and to keep everyone safe. Patient was instructed to sign up for mychart through the link that I sent via text. Patient instructed to give the office a call with any questions .

## 2019-11-09 ENCOUNTER — Ambulatory Visit (INDEPENDENT_AMBULATORY_CARE_PROVIDER_SITE_OTHER): Payer: BC Managed Care – PPO | Admitting: Obstetrics & Gynecology

## 2019-11-09 ENCOUNTER — Other Ambulatory Visit: Payer: Self-pay

## 2019-11-09 ENCOUNTER — Encounter: Payer: Self-pay | Admitting: Obstetrics & Gynecology

## 2019-11-09 ENCOUNTER — Encounter: Payer: Self-pay | Admitting: Family Medicine

## 2019-11-09 DIAGNOSIS — D251 Intramural leiomyoma of uterus: Secondary | ICD-10-CM | POA: Diagnosis not present

## 2019-11-09 DIAGNOSIS — D259 Leiomyoma of uterus, unspecified: Secondary | ICD-10-CM | POA: Insufficient documentation

## 2019-11-09 DIAGNOSIS — D250 Submucous leiomyoma of uterus: Secondary | ICD-10-CM | POA: Diagnosis not present

## 2019-11-09 NOTE — Patient Instructions (Signed)
Uterine Fibroids  Uterine fibroids are lumps of tissue (tumors) in your womb (uterus). They are not cancer (are benign). Most women with this condition do not need treatment. Sometimes fibroids can affect your ability to have children (your fertility). If that happens, you may need surgery to take out the fibroids. Follow these instructions at home:  Take over-the-counter and prescription medicines only as told by your doctor. Your doctor may suggest NSAIDs (such as aspirin or ibuprofen) to help with pain.  Ask your doctor if you should: ? Take iron pills. ? Eat more foods that have iron in them, such as dark green, leafy vegetables.  If directed, apply heat to your back or belly to reduce pain. Use the heat source that your doctor recommends, such as a moist heat pack or a heating pad. ? Put a towel between your skin and the heat source. ? Leave the heat on for 20-30 minutes. ? Remove the heat if your skin turns bright red. This is especially important if you are unable to feel pain, heat, or cold. You may have a greater risk of getting burned.  Pay close attention to your period (menstrual) cycles. Tell your doctor about any changes, such as: ? A heavier blood flow than usual. ? Needing to use more pads or tampons than normal. ? A change in how many days your period lasts. ? A change in symptoms that come with your period, such as cramps or back pain.  Keep all follow-up visits as told by your doctor. This is important. Your doctor may need to watch your fibroids over time for any changes. Contact a doctor if you:  Have pain that does not get better with medicine or heat, such as pain or cramps in: ? Your back. ? The area between your hip bones (pelvic area). ? Your belly.  Have new bleeding between your periods.  Have more bleeding during or between your periods.  Feel very tired or weak.  Feel light-headed. Get help right away if you:  Pass out (faint).  Have pain in the  area between your hip bones that suddenly gets worse.  Have bleeding that soaks a tampon or pad in 30 minutes or less. Summary  Uterine fibroids are lumps of tissue (tumors) in your womb (uterus). They are not cancer.  The only treatment that most women need is taking aspirin or ibuprofen for pain.  Contact a doctor if you have pain or cramps that do not get better with medicine.  Make sure you know what symptoms you should get help for right away. This information is not intended to replace advice given to you by your health care provider. Make sure you discuss any questions you have with your health care provider. Document Released: 12/22/2010 Document Revised: 11/01/2017 Document Reviewed: 10/15/2017 Elsevier Patient Education  2020 Elsevier Inc.  

## 2019-11-09 NOTE — Progress Notes (Signed)
Ultrasounds Results Note  SUBJECTIVE HPI:  Ms. Judith Payne is a 53 y.o. [id number] at Unknown by LMP who presents to the [facility] for followup ultrasound results. The patient denies abdominal pain or vaginal bleeding.  She was seen 10/22 and her pap was normal. F/u 11/19 shoowed fibroids with smaller measurements compared to previous. No bleeding or pelvic pain.  Past Medical History:  Diagnosis Date  . Hypertension    Past Surgical History:  Procedure Laterality Date  . NO PAST SURGERIES     Social History   Socioeconomic History  . Marital status: Legally Separated    Spouse name: Not on file  . Number of children: Not on file  . Years of education: Not on file  . Highest education level: Not on file  Occupational History  . Not on file  Social Needs  . Financial resource strain: Not on file  . Food insecurity    Worry: Not on file    Inability: Not on file  . Transportation needs    Medical: Not on file    Non-medical: Not on file  Tobacco Use  . Smoking status: Current Some Day Smoker  . Smokeless tobacco: Current User  Substance and Sexual Activity  . Alcohol use: Yes    Alcohol/week: 0.0 standard drinks  . Drug use: Never  . Sexual activity: Yes    Birth control/protection: None  Lifestyle  . Physical activity    Days per week: Not on file    Minutes per session: Not on file  . Stress: Not on file  Relationships  . Social Herbalist on phone: Not on file    Gets together: Not on file    Attends religious service: Not on file    Active member of club or organization: Not on file    Attends meetings of clubs or organizations: Not on file    Relationship status: Not on file  . Intimate partner violence    Fear of current or ex partner: Not on file    Emotionally abused: Not on file    Physically abused: Not on file    Forced sexual activity: Not on file  Other Topics Concern  . Not on file  Social History Narrative  . Not on  file   Current Outpatient Medications on File Prior to Visit  Medication Sig Dispense Refill  . lisinopril-hydrochlorothiazide (ZESTORETIC) 20-12.5 MG tablet Take 2 tablets by mouth daily. 180 tablet 0  . Vitamin D, Ergocalciferol, (DRISDOL) 1.25 MG (50000 UT) CAPS capsule Take 1 capsule (50,000 Units total) by mouth every 7 (seven) days. 12 capsule 0   No current facility-administered medications on file prior to visit.    No Known Allergies  I have reviewed patient's Past Medical Hx, Surgical Hx, Family Hx, Social Hx, medications and allergies.   Review of Systems  Constitutional: Negative.   Genitourinary: Negative.   Musculoskeletal:       Occasional leg pain   Review of Systems  Constitutional: Negative for fever and chills.  Gastrointestinal: Negative for nausea, vomiting, abdominal pain, diarrhea and constipation.  Genitourinary: Negative for dysuria.  Musculoskeletal: Negative for back pain.  Neurological: Negative for dizziness and weakness.    Physical Exam  BP (!) 148/105   Pulse 79   Wt 183 lb 12.8 oz (83.4 kg)   BMI 32.56 kg/m   GENERAL: Well-developed, well-nourished female in no acute distress.  HEENT: Normocephalic, atraumatic.   LUNGS: Effort normal HEART:  Regular rate  SKIN: Warm, dry and without erythema PSYCH: Normal mood and affect NEURO: Alert and oriented x 4  LAB RESULTS No results found for this or any previous visit (from the past 24 hour(s)).  IMAGING US Pelvic Complete With Transvaginal  Result Date: 10/21/2019 CLINICAL DATA:  Uterine leiomyoma EXAM: TRANSABDOMINAL AND TRANSVAGINAL ULTRASOUND OF PELVIS TECHNIQUE: Both transabdominal and transvaginal ultrasound examinations of the pelvis were performed. Transabdominal technique was performed for global imaging of the pelvis including uterus, ovaries, adnexal regions, and pelvic cul-de-sac. It was necessary to proceed with endovaginal exam following the transabdominal exam to visualize the  endometrium. COMPARISON:  None FINDINGS: Uterus Measurements: 9.0 x 9.7 x 5.7 cm = volume: 269 mL. Anteverted. Multiple masses consistent with leiomyomata, including a 4.0 x 2.9 x 3.7 cm anterior subserosal leiomyoma, a subserosal posterior leiomyoma 4.8 x 3.6 x 3.8 cm, and an additional posterior subserosal leiomyoma 3.7 x 3.1 x 3.5 cm. Endometrium Thickness: 4 mm. Short segment visualized, normal appearance, remainder obscured by fibroids Right ovary Measurements: 3.3 x 1.6 x 1.5 cm = volume: 4.1 mL. Normal morphology without mass Left ovary Measurements: 2.6 x 1.2 x 1.5 cm = volume: 2.4 mL. Normal morphology without mass Other findings Trace free pelvic fluid.  No adnexal mass. IMPRESSION: Multiple uterine leiomyomata. Suboptimally visualized endometrial complex due to distortion by fibroids. Normal appearing ovaries. Electronically Signed   By: Lavonia Dana M.D.   On: 10/21/2019 14:44    ASSESSMENT 1. Intramural and submucous leiomyoma of uterus   -asymptomatic, nl endometrium  PLAN Discharge home in stable condition Routine mammography RTC 1 year  Woodroe Mode, MD  11/09/2019  9:14 AM

## 2019-11-21 ENCOUNTER — Other Ambulatory Visit: Payer: Self-pay | Admitting: Internal Medicine

## 2019-12-01 ENCOUNTER — Other Ambulatory Visit: Payer: Self-pay | Admitting: Internal Medicine

## 2019-12-01 NOTE — Telephone Encounter (Signed)
Medication Refill - Medication: lisinopril-hydrochlorothiazide (ZESTORETIC) 20-12.5 MG tablet    Preferred Pharmacy:  [id number] [address], [address] Phone:  [phone]  Fax:  [phone]      Pt was advised that RX refills may take up to 3 business days. We ask that you follow-up with your pharmacy.

## 2019-12-01 NOTE — Telephone Encounter (Signed)
Medication was already sent to pharmacy and they are getting it ready. Left a detail vm

## 2019-12-03 ENCOUNTER — Other Ambulatory Visit: Payer: Self-pay | Admitting: Internal Medicine

## 2020-01-07 ENCOUNTER — Other Ambulatory Visit: Payer: Self-pay | Admitting: Internal Medicine

## 2020-02-24 ENCOUNTER — Other Ambulatory Visit: Payer: Self-pay | Admitting: Internal Medicine

## 2020-03-15 ENCOUNTER — Encounter: Payer: Self-pay | Admitting: Internal Medicine

## 2020-04-15 ENCOUNTER — Other Ambulatory Visit: Payer: Self-pay

## 2020-04-15 ENCOUNTER — Ambulatory Visit
Admission: RE | Admit: 2020-04-15 | Discharge: 2020-04-15 | Disposition: A | Discharge status: Home / Self Care | Payer: BC Managed Care – PPO | Source: Ambulatory Visit | Attending: Internal Medicine | Admitting: Internal Medicine

## 2020-04-15 DIAGNOSIS — Z1231 Encounter for screening mammogram for malignant neoplasm of breast: Secondary | ICD-10-CM

## 2020-09-08 ENCOUNTER — Other Ambulatory Visit: Payer: Self-pay | Admitting: Internal Medicine

## 2020-12-11 ENCOUNTER — Ambulatory Visit (HOSPITAL_COMMUNITY)
Admission: EM | Admit: 2020-12-11 | Discharge: 2020-12-11 | Disposition: A | Discharge status: Home / Self Care | Payer: BC Managed Care – PPO | Attending: Family Medicine | Admitting: Family Medicine

## 2020-12-11 ENCOUNTER — Encounter (HOSPITAL_COMMUNITY): Payer: Self-pay

## 2020-12-11 ENCOUNTER — Other Ambulatory Visit: Payer: Self-pay

## 2020-12-11 DIAGNOSIS — H1032 Unspecified acute conjunctivitis, left eye: Secondary | ICD-10-CM

## 2020-12-11 MED ORDER — ERYTHROMYCIN 5 MG/GM OP OINT: TOPICAL_OINTMENT | OPHTHALMIC | 0 refills | Status: DC

## 2020-12-11 MED ORDER — TETRACAINE HCL 0.5 % OP SOLN
OPHTHALMIC | Status: AC
  Filled 2020-12-11: qty 4

## 2020-12-11 MED ORDER — FLUORESCEIN SODIUM 1 MG OP STRP
ORAL_STRIP | OPHTHALMIC | Status: AC
  Filled 2020-12-11: qty 1

## 2020-12-11 NOTE — ED Triage Notes (Signed)
Pt presents with redness and drainage in left eye x 2 days. Benadryl gives some relief.

## 2020-12-11 NOTE — ED Provider Notes (Signed)
[facility]    CSN: [id number] Arrival date & time: 12/11/20  1039      History   Chief Complaint Chief Complaint  Patient presents with  . Eye Problem    HPI Judith Payne is a 55 y.o. female.   Here today with 2 day history of left eye redness, discomfort, upper and lower eyelid edema, and drainage. Denies injury to eye, associated fever, congestion, or other sick sxs, hx of eye issues, headache, visual changes, N/V. Taking benadryl and visine with some benefit and sxs do seem to be lessening at this time.      Past Medical History:  Diagnosis Date  . Hypertension     Patient Active Problem List   Diagnosis Date Noted  . Uterine fibroid 11/09/2019  . Routine general medical examination at a health care facility 08/04/2019  . Pre-diabetes 08/04/2019  . Tobacco abuse 08/04/2019  . Essential hypertension 07/31/2015    Past Surgical History:  Procedure Laterality Date  . NO PAST SURGERIES      OB History    Gravida  5   Para      Term      Preterm      AB  2   Living  3     SAB  2   IAB      Ectopic      Multiple      Live Births  3            Home Medications    Prior to Admission medications   Medication Sig Start Date End Date Taking? Authorizing Provider  erythromycin ophthalmic ointment Place a 1/2 inch ribbon of ointment into the lower eyelid BID prn. 12/11/20  Yes Volney American, PA-C  lisinopril-hydrochlorothiazide (ZESTORETIC) 20-12.5 MG tablet TAKE 2 TABLETS BY MOUTH EVERY DAY 09/08/20   Hoyt Koch, MD  Vitamin D, Ergocalciferol, (DRISDOL) 1.25 MG (50000 UT) CAPS capsule Take 1 capsule (50,000 Units total) by mouth every 7 (seven) days. 08/04/19   Hoyt Koch, MD    Family History Family History  Problem Relation Age of Onset  . Hypertension Mother   . Diabetes Mother   . Cancer Father   . Stroke Sister 56    Social History Social History   Tobacco Use  . Smoking status: Current  Some Day Smoker  . Smokeless tobacco: Current User  Substance Use Topics  . Alcohol use: Yes    Alcohol/week: 0.0 standard drinks  . Drug use: Never     Allergies   Patient has no known allergies.   Review of Systems Review of Systems PER HPI   Physical Exam Triage Vital Signs ED Triage Vitals  Enc Vitals Group     BP 12/11/20 1124 (!) 174/90     Pulse Rate 12/11/20 1124 66     Resp 12/11/20 1124 16     Temp 12/11/20 1124 97.7 F (36.5 C)     Temp Source 12/11/20 1124 Oral     SpO2 12/11/20 1124 100 %     Weight --      Height --      Head Circumference --      Peak Flow --      Pain Score 12/11/20 1123 0     Pain Loc --      Pain Edu? --      Excl. in [place]? --    No data found.  Updated Vital Signs BP (!) 174/90 (BP  Location: Right Arm)   Pulse 66   Temp 97.7 F (36.5 C) (Oral)   Resp 16   SpO2 100%   Visual Acuity Right Eye Distance: 20/25 Left Eye Distance: 20/30 Bilateral Distance: 20/15  Right Eye Near:   Left Eye Near:    Bilateral Near:     Physical Exam Vitals and nursing note reviewed.  Constitutional:      Appearance: Normal appearance. She is not ill-appearing.  HENT:     Head: Atraumatic.     Nose: Nose normal.     Mouth/Throat:     Mouth: Mucous membranes are moist.     Pharynx: Oropharynx is clear.  Eyes:     General:        Left eye: Discharge (clear) present.    Extraocular Movements: Extraocular movements intact.     Comments: Left conjunctival erythema Left upper and lower eyelid erythema and mild edema  Cardiovascular:     Rate and Rhythm: Normal rate and regular rhythm.     Heart sounds: Normal heart sounds.  Pulmonary:     Effort: Pulmonary effort is normal.     Breath sounds: Normal breath sounds.  Abdominal:     General: Bowel sounds are normal. There is no distension.     Palpations: Abdomen is soft.     Tenderness: There is no abdominal tenderness. There is no guarding.  Musculoskeletal:        General: Normal  range of motion.     Cervical back: Normal range of motion and neck supple.  Skin:    General: Skin is warm and dry.  Neurological:     Mental Status: She is alert and oriented to person, place, and time.  Psychiatric:        Mood and Affect: Mood normal.        Thought Content: Thought content normal.        Judgment: Judgment normal.      UC Treatments / Results  Labs (all labs ordered are listed, but only abnormal results are displayed) Labs Reviewed - No data to display  EKG   Radiology No results found.  Procedures Procedures (including critical care time)  Medications Ordered in UC Medications - No data to display  Initial Impression / Assessment and Plan / UC Course  I have reviewed the triage vital signs and the nursing notes.  Pertinent labs & imaging results that were available during my care of the patient were reviewed by me and considered in my medical decision making (see chart for details).     Suspect allergic vs bacterial conjunctivitis. Continue allergy regimen, start erythromycin ointment and compresses. Work note given. Visual acuity today WNL. Return precautions reviewed.   Final Clinical Impressions(s) / UC Diagnoses   Final diagnoses:  Acute bacterial conjunctivitis of left eye   Discharge Instructions   None    ED Prescriptions    Medication Sig Dispense Auth. Provider   erythromycin ophthalmic ointment Place a 1/2 inch ribbon of ointment into the lower eyelid BID prn. 3.5 g Volney American, PA-C     PDMP not reviewed this encounter.   Volney American, [place] 12/11/20 1350

## 2021-02-04 ENCOUNTER — Other Ambulatory Visit: Payer: Self-pay | Admitting: Internal Medicine

## 2021-02-10 ENCOUNTER — Encounter: Payer: Self-pay | Admitting: Internal Medicine

## 2021-02-10 ENCOUNTER — Other Ambulatory Visit: Payer: Self-pay

## 2021-02-10 ENCOUNTER — Ambulatory Visit (INDEPENDENT_AMBULATORY_CARE_PROVIDER_SITE_OTHER): Payer: BC Managed Care – PPO | Admitting: Internal Medicine

## 2021-02-10 VITALS — BP 126/80 | HR 88 | Temp 98.3°F | Resp 18 | Ht 63.0 in | Wt 183.6 lb

## 2021-02-10 DIAGNOSIS — R7303 Prediabetes: Secondary | ICD-10-CM

## 2021-02-10 DIAGNOSIS — Z1159 Encounter for screening for other viral diseases: Secondary | ICD-10-CM | POA: Diagnosis not present

## 2021-02-10 DIAGNOSIS — Z0000 Encounter for general adult medical examination without abnormal findings: Secondary | ICD-10-CM | POA: Diagnosis not present

## 2021-02-10 DIAGNOSIS — Z720 Tobacco use: Secondary | ICD-10-CM

## 2021-02-10 DIAGNOSIS — D251 Intramural leiomyoma of uterus: Secondary | ICD-10-CM

## 2021-02-10 DIAGNOSIS — D250 Submucous leiomyoma of uterus: Secondary | ICD-10-CM

## 2021-02-10 DIAGNOSIS — I10 Essential (primary) hypertension: Secondary | ICD-10-CM

## 2021-02-10 LAB — LIPID PANEL
Cholesterol: 221 mg/dL — ABNORMAL HIGH (ref 0–200)
HDL: 45.5 mg/dL (ref >39.00)
LDL Cholesterol: 149 mg/dL — ABNORMAL HIGH (ref 0–99)
NonHDL: 175.43
Total CHOL/HDL Ratio: 5
Triglycerides: 132 mg/dL (ref 0.0–149.0)
VLDL: 26.4 mg/dL (ref 0.0–40.0)

## 2021-02-10 LAB — COMPREHENSIVE METABOLIC PANEL
ALT: 30 U/L (ref 0–35)
AST: 20 U/L (ref 0–37)
Albumin: 4.1 g/dL (ref 3.5–5.2)
Alkaline Phosphatase: 71 U/L (ref 39–117)
BUN: 17 mg/dL (ref 6–23)
CO2: 26 mEq/L (ref 19–32)
Calcium: 10.3 mg/dL (ref 8.4–10.5)
Chloride: 103 mEq/L (ref 96–112)
Creatinine, Ser: 0.9 mg/dL (ref 0.40–1.20)
GFR: 71.67 mL/min (ref >60.00)
Glucose, Bld: 87 mg/dL (ref 70–99)
Potassium: 3.5 mEq/L (ref 3.5–5.1)
Sodium: 140 mEq/L (ref 135–145)
Total Bilirubin: 0.3 mg/dL (ref 0.2–1.2)
Total Protein: 7.4 g/dL (ref 6.0–8.3)

## 2021-02-10 LAB — CBC
HCT: 43.8 % (ref 36.0–46.0)
Hemoglobin: 14.5 g/dL (ref 12.0–15.0)
MCHC: 33.1 g/dL (ref 30.0–36.0)
MCV: 85.1 fl (ref 78.0–100.0)
Platelets: 272 10*3/uL (ref 150.0–400.0)
RBC: 5.14 Mil/uL — ABNORMAL HIGH (ref 3.87–5.11)
RDW: 14.7 % (ref 11.5–15.5)
WBC: 7.4 10*3/uL (ref 4.0–10.5)

## 2021-02-10 LAB — VITAMIN D 25 HYDROXY (VIT D DEFICIENCY, FRACTURES): VITD: 21.45 ng/mL — ABNORMAL LOW (ref 30.00–100.00)

## 2021-02-10 LAB — HEMOGLOBIN A1C: Hgb A1c MFr Bld: 6.2 % (ref 4.6–6.5)

## 2021-02-10 LAB — TSH: TSH: 1.78 u[IU]/mL (ref 0.35–4.50)

## 2021-02-10 MED ORDER — LISINOPRIL-HYDROCHLOROTHIAZIDE 20-12.5 MG PO TABS: 2.0000 | ORAL_TABLET | Freq: Every day | ORAL | 3 refills | Status: DC

## 2021-02-10 NOTE — Assessment & Plan Note (Signed)
Checking HgA1c, discussed weight loss options including contrave and qsymia. If she decides to start this she would need follow up in 3 months.

## 2021-02-10 NOTE — Assessment & Plan Note (Signed)
Flu shot declines. Covid-19 up to date including booster. Shingrix declines. Tetanus up to date. Colonoscopy she states up to date with Dr. Collene Mares. Mammogram up to date, pap smear up to date. Counseled about sun safety and mole surveillance. Counseled about the dangers of distracted driving. Given 10 year screening recommendations.

## 2021-02-10 NOTE — Progress Notes (Signed)
   Subjective:   Patient ID: Judith Payne, female    DOB: Nov 17, 1966, 55 y.o.   MRN: [id number]  HPI The patient is a 55 YO female coming in for physical.  PMH, [place], social history reviewed and updated  Review of Systems  Constitutional: Negative.   HENT: Negative.   Eyes: Negative.   Respiratory: Negative for cough, chest tightness and shortness of breath.   Cardiovascular: Negative for chest pain, palpitations and leg swelling.  Gastrointestinal: Negative for abdominal distention, abdominal pain, constipation, diarrhea, nausea and vomiting.  Musculoskeletal: Negative.   Skin: Negative.   Neurological: Negative.   Psychiatric/Behavioral: Negative.     Objective:  Physical Exam Constitutional:      Appearance: She is well-developed.  HENT:     Head: Normocephalic and atraumatic.  Cardiovascular:     Rate and Rhythm: Normal rate and regular rhythm.  Pulmonary:     Effort: Pulmonary effort is normal. No respiratory distress.     Breath sounds: Normal breath sounds. No wheezing or rales.  Abdominal:     General: Bowel sounds are normal. There is no distension.     Palpations: Abdomen is soft.     Tenderness: There is no abdominal tenderness. There is no rebound.  Musculoskeletal:     Cervical back: Normal range of motion.  Skin:    General: Skin is warm and dry.  Neurological:     Mental Status: She is alert and oriented to person, place, and time.     Coordination: Coordination normal.     Vitals:   02/10/21 1445  BP: 126/80  Pulse: 88  Resp: 18  Temp: 98.3 F (36.8 C)  TempSrc: Oral  SpO2: 96%  Weight: 183 lb 9.6 oz (83.3 kg)  Height: 5\' 3"  (1.6 m)    This visit occurred during the SARS-CoV-2 public health emergency.  Safety protocols were in place, including screening questions prior to the visit, additional usage of staff PPE, and extensive cleaning of exam room while observing appropriate contact time as indicated for disinfecting solutions.    Assessment & Plan:

## 2021-02-10 NOTE — Patient Instructions (Addendum)
Contrave and Qsymia are the two medicines to help you lose weight. Let us know if you want to try one of these.    Health Maintenance, Female Adopting a healthy lifestyle and getting preventive care are important in promoting health and wellness. Ask your health care provider about:  The right schedule for you to have regular tests and exams.  Things you can do on your own to prevent diseases and keep yourself healthy. What should I know about diet, weight, and exercise? Eat a healthy diet  Eat a diet that includes plenty of vegetables, fruits, low-fat dairy products, and lean protein.  Do not eat a lot of foods that are high in solid fats, added sugars, or sodium.   Maintain a healthy weight Body mass index (BMI) is used to identify weight problems. It estimates body fat based on height and weight. Your health care provider can help determine your BMI and help you achieve or maintain a healthy weight. Get regular exercise Get regular exercise. This is one of the most important things you can do for your health. Most adults should:  Exercise for at least 150 minutes each week. The exercise should increase your heart rate and make you sweat (moderate-intensity exercise).  Do strengthening exercises at least twice a week. This is in addition to the moderate-intensity exercise.  Spend less time sitting. Even light physical activity can be beneficial. Watch cholesterol and blood lipids Have your blood tested for lipids and cholesterol at 56 years of age, then have this test every 5 years. Have your cholesterol levels checked more often if:  Your lipid or cholesterol levels are high.  You are older than 56 years of age.  You are at high risk for heart disease. What should I know about cancer screening? Depending on your health history and family history, you may need to have cancer screening at various ages. This may include screening for:  Breast cancer.  Cervical  cancer.  Colorectal cancer.  Skin cancer.  Lung cancer. What should I know about heart disease, diabetes, and high blood pressure? Blood pressure and heart disease  High blood pressure causes heart disease and increases the risk of stroke. This is more likely to develop in people who have high blood pressure readings, are of African descent, or are overweight.  Have your blood pressure checked: ? Every 3-5 years if you are 73-46 years of age. ? Every year if you are 33 years old or older. Diabetes Have regular diabetes screenings. This checks your fasting blood sugar level. Have the screening done:  Once every three years after age 29 if you are at a normal weight and have a low risk for diabetes.  More often and at a younger age if you are overweight or have a high risk for diabetes. What should I know about preventing infection? Hepatitis B If you have a higher risk for hepatitis B, you should be screened for this virus. Talk with your health care provider to find out if you are at risk for hepatitis B infection. Hepatitis C Testing is recommended for:  Everyone born from [birth year] through 1966.  Anyone with known risk factors for hepatitis C. Sexually transmitted infections (STIs)  Get screened for STIs, including gonorrhea and chlamydia, if: ? You are sexually active and are younger than 56 years of age. ? You are older than 56 years of age and your health care provider tells you that you are at risk for this type of infection. ?  Your sexual activity has changed since you were last screened, and you are at increased risk for chlamydia or gonorrhea. Ask your health care provider if you are at risk.  Ask your health care provider about whether you are at high risk for HIV. Your health care provider may recommend a prescription medicine to help prevent HIV infection. If you choose to take medicine to prevent HIV, you should first get tested for HIV. You should then be tested every 3  months for as long as you are taking the medicine. Pregnancy  If you are about to stop having your period (premenopausal) and you may become pregnant, seek counseling before you get pregnant.  Take 400 to 800 micrograms (mcg) of folic acid every day if you become pregnant.  Ask for birth control (contraception) if you want to prevent pregnancy. Osteoporosis and menopause Osteoporosis is a disease in which the bones lose minerals and strength with aging. This can result in bone fractures. If you are 64 years old or older, or if you are at risk for osteoporosis and fractures, ask your health care provider if you should:  Be screened for bone loss.  Take a calcium or vitamin D supplement to lower your risk of fractures.  Be given hormone replacement therapy (HRT) to treat symptoms of menopause. Follow these instructions at home: Lifestyle  Do not use any products that contain nicotine or tobacco, such as cigarettes, e-cigarettes, and chewing tobacco. If you need help quitting, ask your health care provider.  Do not use street drugs.  Do not share needles.  Ask your health care provider for help if you need support or information about quitting drugs. Alcohol use  Do not drink alcohol if: ? Your health care provider tells you not to drink. ? You are pregnant, may be pregnant, or are planning to become pregnant.  If you drink alcohol: ? Limit how much you use to 0-1 drink a day. ? Limit intake if you are breastfeeding.  Be aware of how much alcohol is in your drink. In the [place] one drink equals one 12 oz bottle of beer (355 mL), one 5 oz glass of wine (148 mL), or one 1 oz glass of hard liquor (44 mL). General instructions  Schedule regular health, dental, and eye exams.  Stay current with your vaccines.  Tell your health care provider if: ? You often feel depressed. ? You have ever been abused or do not feel safe at home. Summary  Adopting a healthy lifestyle and getting  preventive care are important in promoting health and wellness.  Follow your health care provider's instructions about healthy diet, exercising, and getting tested or screened for diseases.  Follow your health care provider's instructions on monitoring your cholesterol and blood pressure. This information is not intended to replace advice given to you by your health care provider. Make sure you discuss any questions you have with your health care provider. Document Revised: 11/12/2018 Document Reviewed: 11/12/2018 Elsevier Patient Education  2021 [employer].

## 2021-02-10 NOTE — Assessment & Plan Note (Signed)
Smoking a few cigarettes per day and recommended quitting. Reminded about the risk to her health from smoking.

## 2021-02-10 NOTE — Assessment & Plan Note (Signed)
Referral to gyn as she wants these evaluated. As she is not having bleeding or consistent pain I do not have an indication for repeat imaging at this time.

## 2021-02-10 NOTE — Assessment & Plan Note (Signed)
BP at goal on lisinopril/hctz 40/25 mg. Checking CMP and adjust as needed.

## 2021-02-13 LAB — HEPATITIS C ANTIBODY
Hepatitis C Ab: NONREACTIVE
SIGNAL TO CUT-OFF: 0.01 (ref <1.00)

## 2021-03-06 ENCOUNTER — Encounter: Payer: BC Managed Care – PPO | Admitting: Obstetrics & Gynecology

## 2021-03-08 ENCOUNTER — Ambulatory Visit: Payer: BC Managed Care – PPO | Admitting: Obstetrics & Gynecology

## 2021-03-20 ENCOUNTER — Other Ambulatory Visit: Payer: Self-pay | Admitting: Internal Medicine

## 2021-03-20 DIAGNOSIS — Z1231 Encounter for screening mammogram for malignant neoplasm of breast: Secondary | ICD-10-CM

## 2021-04-26 ENCOUNTER — Encounter: Payer: Self-pay | Admitting: Obstetrics & Gynecology

## 2021-04-26 ENCOUNTER — Other Ambulatory Visit: Payer: Self-pay

## 2021-04-26 ENCOUNTER — Ambulatory Visit: Payer: BC Managed Care – PPO | Admitting: Obstetrics & Gynecology

## 2021-04-26 VITALS — BP 126/80 | Ht 63.0 in | Wt 183.0 lb

## 2021-04-26 DIAGNOSIS — D259 Leiomyoma of uterus, unspecified: Secondary | ICD-10-CM

## 2021-04-26 NOTE — Progress Notes (Signed)
Judith Payne [birth year] [id number]   History:    55 y.o. [id number] Married  RP:  New patient presenting for counseling and management of Uterine Fibroids  HPI: Postmenopause x >10 yrs.  No HRT.  No PMB.  Had intermittent Rt lower abdominal pain/discomfort a month ago, no current pain.  Last Pelvic US 10/2019 showing fibroids.   Past medical history,surgical history, family history and social history were all reviewed and documented in the EPIC chart.  Gynecologic History No LMP recorded. Patient is postmenopausal.  Obstetric History OB History  Gravida Para Term Preterm AB Living  5       2 3   SAB IAB Ectopic Multiple Live Births  2       3    # Outcome Date GA Lbr Len/2nd Weight Sex Delivery Anes PTL Lv  5 Gravida           4 Gravida           3 Gravida           2 SAB           1 SAB              ROS: A ROS was performed and pertinent positives and negatives are included in the history.  GENERAL: No fevers or chills. HEENT: No change in vision, no earache, sore throat or sinus congestion. NECK: No pain or stiffness. CARDIOVASCULAR: No chest pain or pressure. No palpitations. PULMONARY: No shortness of breath, cough or wheeze. GASTROINTESTINAL: No abdominal pain, nausea, vomiting or diarrhea, melena or bright red blood per rectum. GENITOURINARY: No urinary frequency, urgency, hesitancy or dysuria. MUSCULOSKELETAL: No joint or muscle pain, no back pain, no recent trauma. DERMATOLOGIC: No rash, no itching, no lesions. ENDOCRINE: No polyuria, polydipsia, no heat or cold intolerance. No recent change in weight. HEMATOLOGICAL: No anemia or easy bruising or bleeding. NEUROLOGIC: No headache, seizures, numbness, tingling or weakness. PSYCHIATRIC: No depression, no loss of interest in normal activity or change in sleep pattern.     Exam:   BP 126/80 (BP Location: Right Arm, Patient Position: Sitting, Cuff Size: Normal)   Ht 5\' 3"  (1.6 m)   Wt 183 lb (83 kg)   BMI 32.42 kg/m    Body mass index is 32.42 kg/m.  General appearance : Well developed well nourished female. No acute distress  Pelvic: Vulva: Normal             Vagina: No gross lesions or discharge  Cervix: No gross lesions or discharge  Uterus  AV, nodular, about 10 cm in diameter, non-tender and mobile  Adnexa  Without masses or tenderness  Anus: Normal  Pelvic US 10/31/2019:  Uterus: Measurements: 9.0 x 9.7 x 5.7 cm = volume: 269 mL. Anteverted. Multiple masses consistent with leiomyomata, including a 4.0 x 2.9 x 3.7 cm anterior subserosal leiomyoma, a subserosal posterior leiomyoma 4.8 x 3.6 x 3.8 cm, and an additional posterior subserosal leiomyoma 3.7 x 3.1 x 3.5 cm. Endometrium Thickness: 4 mm. Short segment visualized, normal appearance, remainder obscured by fibroids Right ovary Measurements: 3.3 x 1.6 x 1.5 cm = volume: 4.1 mL. Normal morphology without mass Left ovary Measurements: 2.6 x 1.2 x 1.5 cm = volume: 2.4 mL. Normal morphology without mass Other findings Trace free pelvic fluid.  No adnexal mass. IMPRESSION: Multiple uterine leiomyomata. Suboptimally visualized endometrial complex due to distortion by fibroids. Normal appearing ovaries.   Assessment/Plan:  55 y.o. female   1. Uterine leiomyoma,  unspecified location Reassured about previous Pelvic US 10/2019.  Currently asymptomatic uterine fibroids in menopause.  Will complete the investigation with a repeat Pelvic US at f/u to confirm stability of the fibroids. - US Transvaginal Non-OB; Future  Princess Bruins MD, 12:19 PM 04/26/2021

## 2021-05-01 ENCOUNTER — Encounter: Payer: Self-pay | Admitting: Obstetrics & Gynecology

## 2021-05-09 ENCOUNTER — Ambulatory Visit
Admission: RE | Admit: 2021-05-09 | Discharge: 2021-05-09 | Disposition: A | Discharge status: Home / Self Care | Payer: BC Managed Care – PPO | Source: Ambulatory Visit | Attending: Internal Medicine | Admitting: Internal Medicine

## 2021-05-09 ENCOUNTER — Other Ambulatory Visit: Payer: Self-pay

## 2021-05-09 DIAGNOSIS — Z1231 Encounter for screening mammogram for malignant neoplasm of breast: Secondary | ICD-10-CM

## 2021-06-15 ENCOUNTER — Other Ambulatory Visit: Payer: BC Managed Care – PPO

## 2021-06-15 ENCOUNTER — Other Ambulatory Visit: Payer: BC Managed Care – PPO | Admitting: Obstetrics & Gynecology

## 2021-08-17 ENCOUNTER — Ambulatory Visit: Payer: BC Managed Care – PPO | Admitting: Obstetrics & Gynecology

## 2021-08-17 ENCOUNTER — Encounter: Payer: Self-pay | Admitting: Obstetrics & Gynecology

## 2021-08-17 ENCOUNTER — Ambulatory Visit (INDEPENDENT_AMBULATORY_CARE_PROVIDER_SITE_OTHER): Payer: BC Managed Care – PPO

## 2021-08-17 ENCOUNTER — Other Ambulatory Visit: Payer: Self-pay

## 2021-08-17 VITALS — BP 118/78

## 2021-08-17 DIAGNOSIS — Z780 Asymptomatic menopausal state: Secondary | ICD-10-CM

## 2021-08-17 DIAGNOSIS — D251 Intramural leiomyoma of uterus: Secondary | ICD-10-CM

## 2021-08-17 DIAGNOSIS — D252 Subserosal leiomyoma of uterus: Secondary | ICD-10-CM

## 2021-08-17 DIAGNOSIS — D259 Leiomyoma of uterus, unspecified: Secondary | ICD-10-CM

## 2021-08-17 NOTE — Progress Notes (Signed)
    Judith Payne 1966-04-08 [id number]        55 y.o.  [id number]   RP: Fibroids for Pelvic US  HPI: No current abdominopelvic pain.  Seen in 04/2021: Postmenopause x >10 yrs.  No HRT.  No PMB.  Had intermittent Rt lower abdominal pain/discomfort a month ago, no current pain.  Last Pelvic US 10/2019 showing fibroids.     OB History  Gravida Para Term Preterm AB Living  '5       2 3  '$ SAB IAB Ectopic Multiple Live Births  2       3    # Outcome Date GA Lbr Len/2nd Weight Sex Delivery Anes PTL Lv  5 Gravida           4 Gravida           3 Gravida           2 SAB           1 SAB             Past medical history,surgical history, problem list, medications, allergies, family history and social history were all reviewed and documented in the EPIC chart.   Directed ROS with pertinent positives and negatives documented in the history of present illness/assessment and plan.  Exam:  Vitals:   08/17/21 0949  BP: 118/78   General appearance:  Normal  Pelvic US today: T/V images.  Retroverted uterus with multiple subserosal and intramural fibroids.  The largest fibroid is 4.2 cm, it is a subserous anterior fibroid.  The overall uterine size is measured at 9.59 x 10.55 x 8.91 cm.  The endometrial lining is thin and symmetrical with no obvious mass seen.  The right ovary is normal.  The left ovary was not visualized.  No adnexal mass seen.  No free fluid in the pelvis.   Assessment/Plan:  55 y.o. [id number]   1. Intramural and subserous leiomyoma of uterus Stable asymptomatic uterine fibroids.  Fam h/o Gynecologic Ca.  Patient reassured.  Will observe and repeat a Pelvic US with next Annual Gyn exam in 05/2022. - US Transvaginal Non-OB; Future in 05/2022  2. Postmenopause  Well on no HRT.  No PMB.  Princess Bruins MD, 10:46 AM 08/17/2021

## 2021-08-19 ENCOUNTER — Encounter: Payer: Self-pay | Admitting: Obstetrics & Gynecology

## 2022-01-11 ENCOUNTER — Other Ambulatory Visit: Payer: Self-pay

## 2022-01-11 ENCOUNTER — Ambulatory Visit: Payer: BC Managed Care – PPO | Admitting: Podiatry

## 2022-01-23 ENCOUNTER — Other Ambulatory Visit: Payer: Self-pay

## 2022-01-23 ENCOUNTER — Ambulatory Visit (INDEPENDENT_AMBULATORY_CARE_PROVIDER_SITE_OTHER): Payer: BC Managed Care – PPO

## 2022-01-23 ENCOUNTER — Encounter: Payer: Self-pay | Admitting: Podiatry

## 2022-01-23 ENCOUNTER — Ambulatory Visit (INDEPENDENT_AMBULATORY_CARE_PROVIDER_SITE_OTHER): Payer: BC Managed Care – PPO | Admitting: Podiatry

## 2022-01-23 DIAGNOSIS — M19071 Primary osteoarthritis, right ankle and foot: Secondary | ICD-10-CM

## 2022-01-23 DIAGNOSIS — L603 Nail dystrophy: Secondary | ICD-10-CM

## 2022-01-23 DIAGNOSIS — M778 Other enthesopathies, not elsewhere classified: Secondary | ICD-10-CM | POA: Diagnosis not present

## 2022-01-23 MED ORDER — TRIAMCINOLONE ACETONIDE 40 MG/ML IJ SUSP
20.0000 mg | Freq: Once | INTRAMUSCULAR | Status: AC
  Administered 2022-01-23: 20 mg

## 2022-01-23 NOTE — Progress Notes (Signed)
She presents today chief concern of a knot sticking out of the top of her foot.  She is also concerned about her thick toenails.  States that the knots been sticking out of the top of her foot for quite some time is painful with shoe gear.  Is painful if anything touches it.  She works at [employer].  Objective: Vital signs temps alert and oriented x3 there is no erythema edema cellulitis drainage or odor pulses are palpable.  She does have nodular masses to the dorsal aspect of the tarsometatarsal joints right over left.  Exquisitely tender on palpation.  She does have nail dystrophy to the hallux nails bilaterally.  Radiographs taken today of the right foot demonstrate osseously mature foot with significant osteoarthritic changes at the second and third tarsometatarsal joints with dorsal spurring.  No fractures are identified.  Assessment: Nail dystrophy.  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  Plan: Discussed etiology pathology and surgical therapies injected the area today with Kenalog and local anesthetic a total of 10 mg was utilized.  I did take samples of the skin and nail to be sent for pathologic evaluation of back to follow-up with her in 1 month.

## 2022-02-07 ENCOUNTER — Encounter: Payer: Self-pay | Admitting: *Deleted

## 2022-02-22 ENCOUNTER — Ambulatory Visit: Payer: BC Managed Care – PPO | Admitting: Podiatry

## 2022-02-26 ENCOUNTER — Other Ambulatory Visit: Payer: Self-pay | Admitting: Internal Medicine

## 2022-03-06 ENCOUNTER — Ambulatory Visit: Payer: BC Managed Care – PPO | Admitting: Podiatry

## 2022-03-15 ENCOUNTER — Ambulatory Visit: Payer: BC Managed Care – PPO | Admitting: Podiatry

## 2022-03-27 ENCOUNTER — Other Ambulatory Visit: Payer: Self-pay | Admitting: Internal Medicine

## 2022-04-10 ENCOUNTER — Other Ambulatory Visit: Payer: Self-pay | Admitting: Internal Medicine

## 2022-04-10 DIAGNOSIS — Z1231 Encounter for screening mammogram for malignant neoplasm of breast: Secondary | ICD-10-CM

## 2022-05-11 ENCOUNTER — Ambulatory Visit
Admission: RE | Admit: 2022-05-11 | Discharge: 2022-05-11 | Disposition: A | Discharge status: Home / Self Care | Payer: BC Managed Care – PPO | Source: Ambulatory Visit | Attending: Internal Medicine | Admitting: Internal Medicine

## 2022-05-11 DIAGNOSIS — Z1231 Encounter for screening mammogram for malignant neoplasm of breast: Secondary | ICD-10-CM

## 2022-05-31 ENCOUNTER — Other Ambulatory Visit: Payer: BC Managed Care – PPO | Admitting: Obstetrics & Gynecology

## 2022-05-31 ENCOUNTER — Other Ambulatory Visit: Payer: BC Managed Care – PPO

## 2022-05-31 ENCOUNTER — Ambulatory Visit: Payer: Self-pay | Admitting: Obstetrics & Gynecology

## 2022-05-31 DIAGNOSIS — Z0289 Encounter for other administrative examinations: Secondary | ICD-10-CM

## 2022-06-26 ENCOUNTER — Telehealth: Payer: Self-pay | Admitting: Obstetrics & Gynecology

## 2022-06-26 DIAGNOSIS — D219 Benign neoplasm of connective and other soft tissue, unspecified: Secondary | ICD-10-CM

## 2022-06-26 NOTE — Telephone Encounter (Signed)
Patient no show to her ultrasound appointment on June 29; 2 messages have been left for patient to call and reschedule appointment. Patient has not rescheduled.

## 2022-07-03 NOTE — Telephone Encounter (Signed)
Left message to call Sharee Pimple, RN at [facility], [phone], OPT 5.   OV 08/17/21 -PUS recommended for fibroids. Return for AEX and f/u PUS for fibroids 05/2022

## 2022-07-28 IMAGING — MG MM DIGITAL SCREENING BILAT W/ TOMO AND CAD
6 of 12 series · 6 of 36 positions shown · non-contrast
Comparison: Previous exam(s).

CLINICAL DATA: Screening.

EXAM:
DIGITAL SCREENING BILATERAL MAMMOGRAM WITH TOMOSYNTHESIS AND CAD
TECHNIQUE: Bilateral screening digital craniocaudal and mediolateral oblique
mammograms were obtained. Bilateral screening digital breast
tomosynthesis was performed. The images were evaluated with
computer-aided detection.

[R XCCL synth-2D]
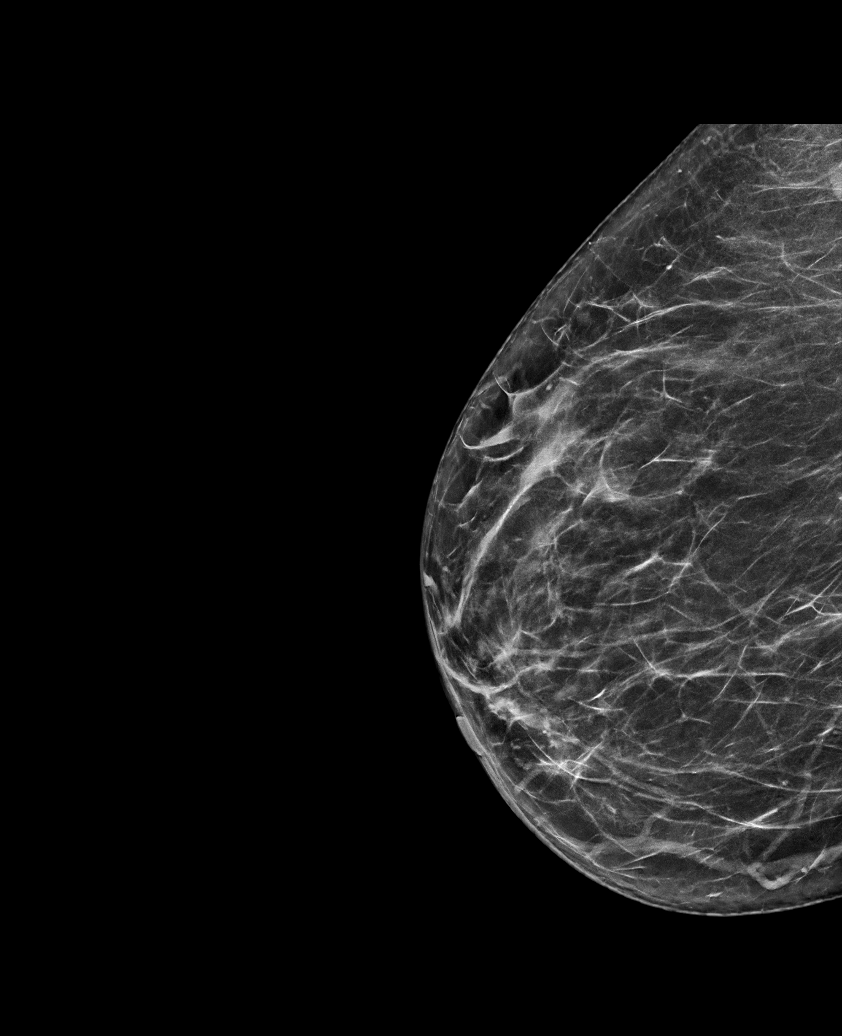

[L MLO synth-2D (1 of 2)]
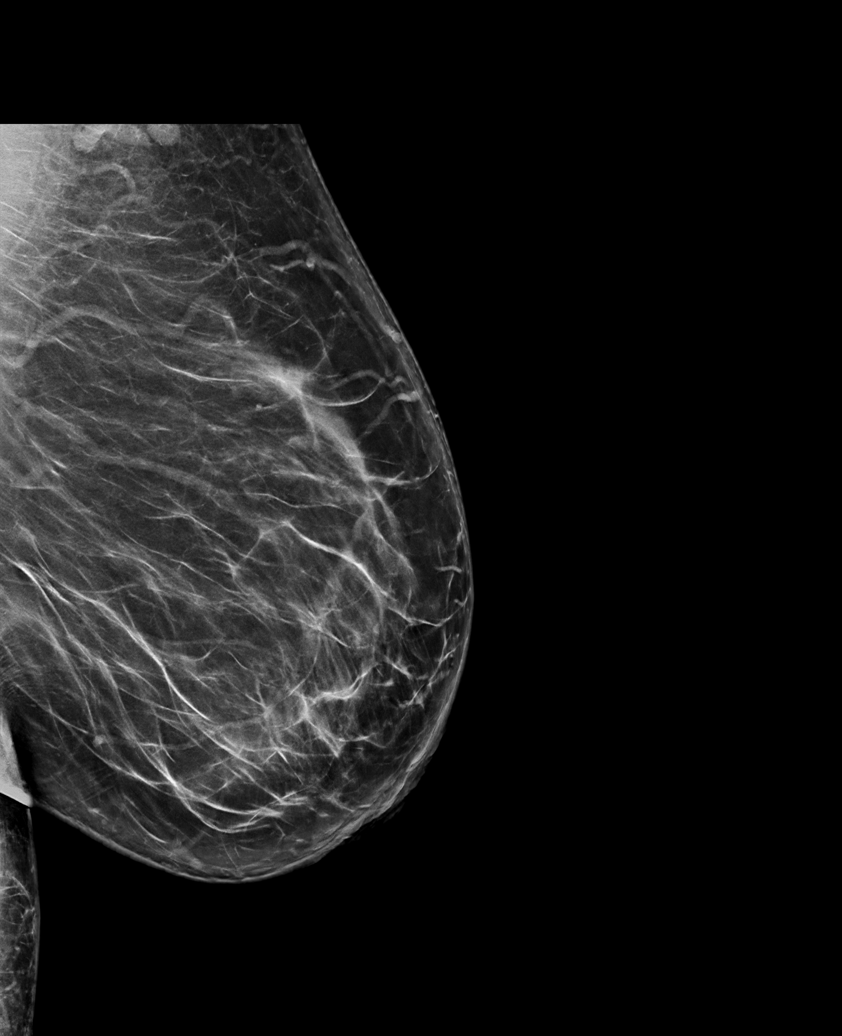

[R MLO synth-2D]
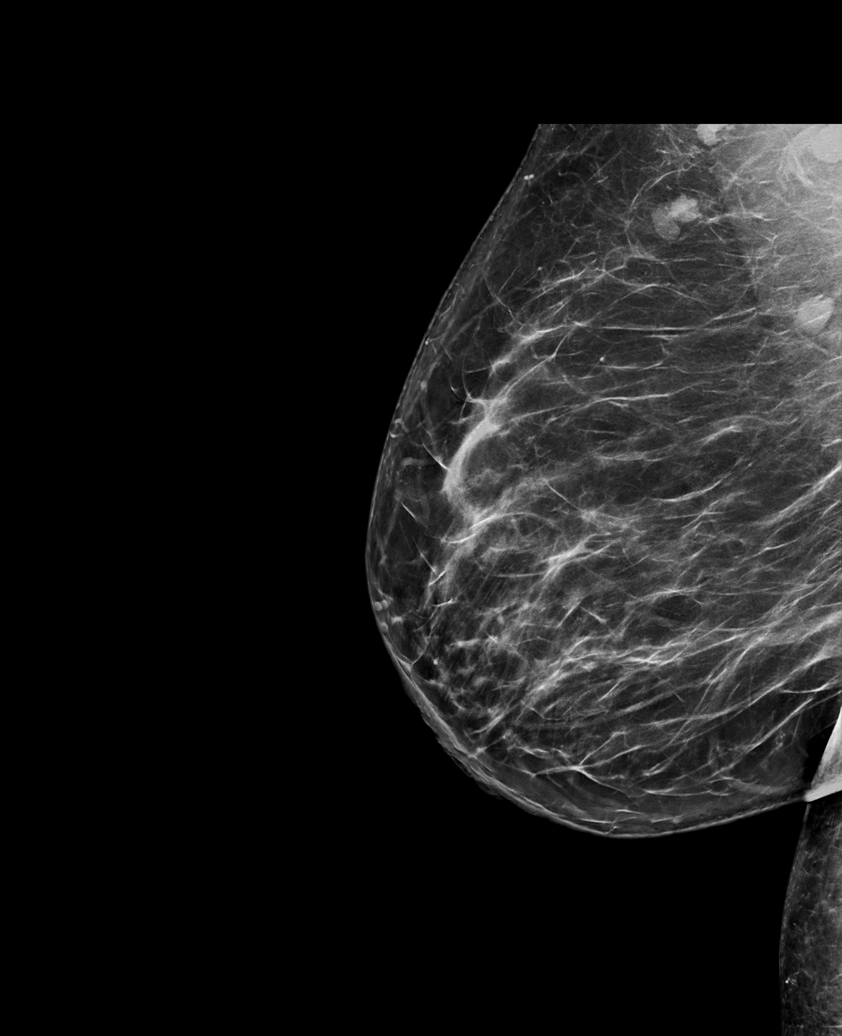

[R CC synth-2D]
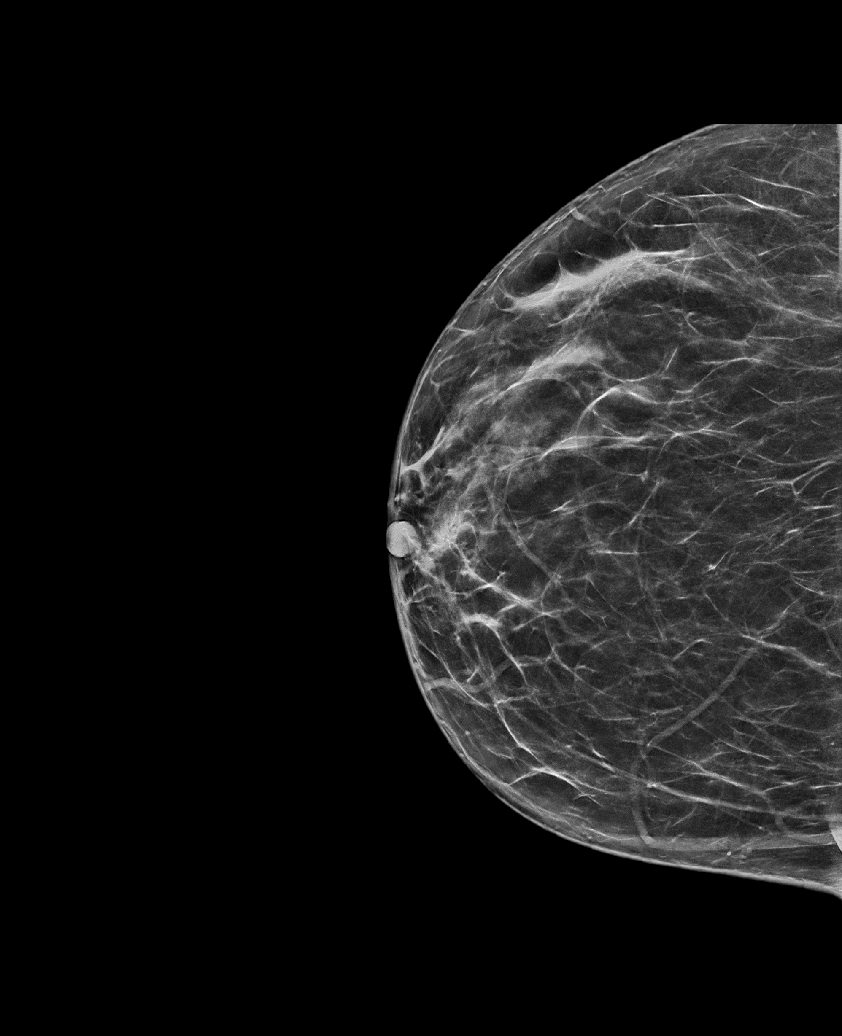

[L MLO synth-2D (2 of 2)]
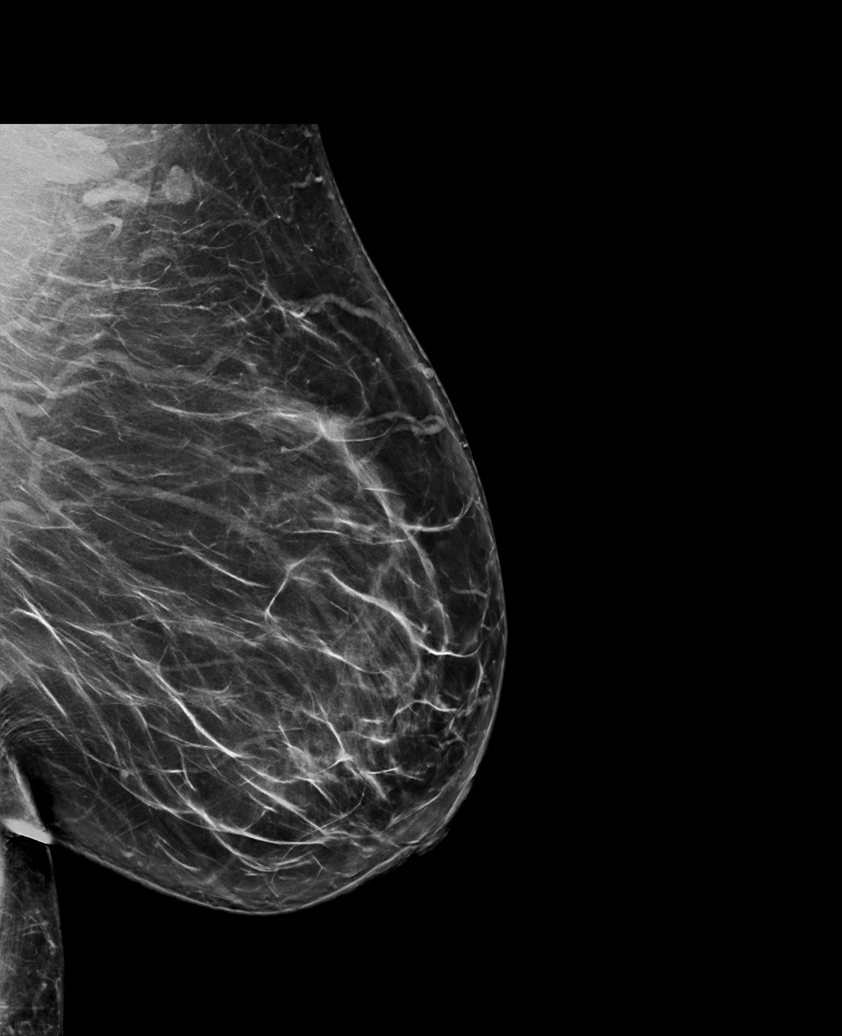

[L CC synth-2D]
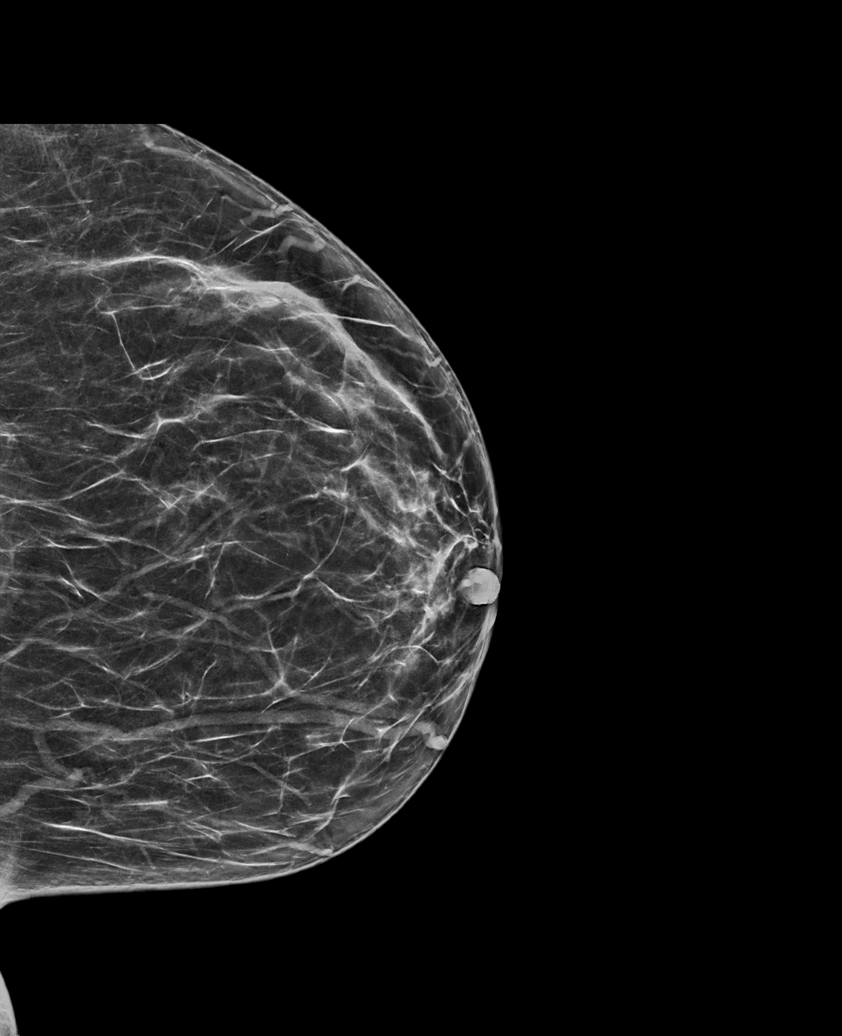

[6 of 36 positions shown; findings below may reference images not displayed]

ACR Breast Density Category b: There are scattered areas of
fibroglandular density.
FINDINGS: There are no findings suspicious for malignancy.
IMPRESSION: No mammographic evidence of malignancy. A result letter of this
screening mammogram will be mailed directly to the patient.

RECOMMENDATION:
Screening mammogram in one year. (Code:51-O-LD2)

BI-RADS CATEGORY  1: Negative.

## 2022-08-01 NOTE — Telephone Encounter (Signed)
No response from patient.   Letter reviewed and signed by Dr. Dellis Filbert.  Mailed to address on file by standard [place] mail.   Routing to provider for final review.  Will close encounter.

## 2022-08-08 NOTE — Addendum Note (Signed)
Addended by: Thamas Jaegers on: 08/08/2022 09:39 AM   Modules accepted: Orders

## 2022-08-08 NOTE — Telephone Encounter (Signed)
Staff message from appointment desk "Leamon Arnt, [place] Triage Patient resch her [place] and aex from 05/31/22 to 09/13/22. Can you place new order? [place] order expires for 08/17/22  Thanks    New order placed for ultrasound per above message.

## 2022-09-13 ENCOUNTER — Other Ambulatory Visit: Payer: BC Managed Care – PPO

## 2022-09-13 ENCOUNTER — Other Ambulatory Visit: Payer: BC Managed Care – PPO | Admitting: Obstetrics & Gynecology

## 2022-09-27 ENCOUNTER — Other Ambulatory Visit: Payer: BC Managed Care – PPO | Admitting: Obstetrics & Gynecology

## 2022-09-27 ENCOUNTER — Ambulatory Visit (INDEPENDENT_AMBULATORY_CARE_PROVIDER_SITE_OTHER): Payer: BC Managed Care – PPO

## 2022-09-27 ENCOUNTER — Encounter: Payer: Self-pay | Admitting: Obstetrics & Gynecology

## 2022-09-27 ENCOUNTER — Ambulatory Visit (INDEPENDENT_AMBULATORY_CARE_PROVIDER_SITE_OTHER): Payer: BC Managed Care – PPO | Admitting: Obstetrics & Gynecology

## 2022-09-27 VITALS — BP 120/80 | HR 79

## 2022-09-27 DIAGNOSIS — D219 Benign neoplasm of connective and other soft tissue, unspecified: Secondary | ICD-10-CM | POA: Diagnosis not present

## 2022-09-27 DIAGNOSIS — D251 Intramural leiomyoma of uterus: Secondary | ICD-10-CM

## 2022-09-27 NOTE — Progress Notes (Signed)
      Judith Payne 31-Mar-1966 [id number]        56 y.o.  [id number]   RP: Pelvic US for f/u Uterine Fibroids   HPI: Postmenopause, well on no HRT.  No PMB.  No pelvic pain.   OB History  Gravida Para Term Preterm AB Living  '5 3 3   2 3  '$ SAB IAB Ectopic Multiple Live Births  2       3    # Outcome Date GA Lbr Len/2nd Weight Sex Delivery Anes PTL Lv  5 Term           4 Term           3 Term           2 SAB           1 SAB             Past medical history,surgical history, problem list, medications, allergies, family history and social history were all reviewed and documented in the EPIC chart.   Directed ROS with pertinent positives and negatives documented in the history of present illness/assessment and plan.  Exam:  Vitals:   09/27/22 1542  BP: 120/80  Pulse: 79  SpO2: 99%   General appearance:  Normal  Pelvic US today: Comparison is made with previous scan September 2022.  Retroverted uterus with overall size slightly decreased since previous scan.  Multiple intramural and subserosal fibroids with the largest fibroid slightly decreased in size since the previous scan.  The overall uterine size is measured at 9.18 x 9.35 x 8.52 cm.  The largest fibroid is measured at 4 x 3.2 cm.  Thin and symmetrical endometrial lining measured at 3.9 mm with no endometrial mass seen.  Both ovaries are identified and are small with atrophic appearance.  No adnexal mass.  No free fluid in the pelvis.   Assessment/Plan:  56 y.o. [id number]   1. Intramural and subserous leiomyoma of uterus  Postmenopause, well on no HRT.  No PMB.  No pelvic pain.  Pelvic ultrasound findings thoroughly reviewed with patient.  Patient reassured that her overall uterine size and fibroids are slightly decreased in size since the previous scan.  The endometrial lining is reassuring, being thin and symmetrical at 3.9 mm.  Patient reassured about the findings and given that she is as symptomatic, will continue to  observe.   Princess Bruins MD, 3:58 PM 09/27/2022

## 2022-10-01 ENCOUNTER — Encounter: Payer: Self-pay | Admitting: Obstetrics & Gynecology

## 2022-10-05 ENCOUNTER — Ambulatory Visit (HOSPITAL_COMMUNITY)
Admission: EM | Admit: 2022-10-05 | Discharge: 2022-10-05 | Disposition: A | Discharge status: Home / Self Care | Payer: BC Managed Care – PPO | Attending: Emergency Medicine | Admitting: Emergency Medicine

## 2022-10-05 ENCOUNTER — Encounter (HOSPITAL_COMMUNITY): Payer: Self-pay

## 2022-10-05 DIAGNOSIS — T7840XA Allergy, unspecified, initial encounter: Secondary | ICD-10-CM | POA: Diagnosis not present

## 2022-10-05 DIAGNOSIS — R21 Rash and other nonspecific skin eruption: Secondary | ICD-10-CM | POA: Diagnosis not present

## 2022-10-05 MED ORDER — TRIAMCINOLONE ACETONIDE 0.1 % EX CREA: 1.0000 | TOPICAL_CREAM | Freq: Two times a day (BID) | CUTANEOUS | 0 refills | Status: DC

## 2022-10-05 MED ORDER — HYDROXYZINE HCL 25 MG PO TABS: 25.0000 mg | ORAL_TABLET | Freq: Three times a day (TID) | ORAL | 0 refills | Status: DC | PRN

## 2022-10-05 MED ORDER — PREDNISONE 20 MG PO TABS: 40.0000 mg | ORAL_TABLET | Freq: Every day | ORAL | 0 refills | Status: DC

## 2022-10-05 NOTE — Discharge Instructions (Addendum)
You can start taking the hydroxyzine tonight, this medication can make you sleepy, advised that you do not operate any heavy machinery or car after taking this medication.  You can take this every 8 hours as needed.  Start taking prednisone tomorrow morning, you can take this medication every morning for the next 6 days, take 2 tablets at a time.  You can start applying the Kenalog cream tonight, you can apply this 2 times daily.  Please discontinue use after 4 weeks of use.  Please return if symptoms do not improve within 4 to 5 days.

## 2022-10-05 NOTE — ED Provider Notes (Signed)
[place]    CSN: [id number] Arrival date & time: 10/05/22  1826      History   Chief Complaint Chief Complaint  Patient presents with   Rash    HPI Judith Payne is a 56 y.o. female.  Patient complaining of rash on right side of neck that started last night.  Patient denies any possible agent that she could have came in contact with that could cause the rash.  Patient reports itchiness to site. Patient denies SOB or throat tightness. Patient denies pain at site. Patient denies drainage to site. Patient has placed alcohol and over-the-counter Benadryl cream with minimal relief of symptoms.    Rash Associated symptoms: no fatigue, no fever and no shortness of breath     Past Medical History:  Diagnosis Date   Hypertension     Patient Active Problem List   Diagnosis Date Noted   Uterine fibroid 11/09/2019   Routine general medical examination at a health care facility 08/04/2019   Pre-diabetes 08/04/2019   Tobacco abuse 08/04/2019   Essential hypertension 07/31/2015   Cutaneous wart 07/24/2012   Skin tag 07/24/2012   Trichomoniasis 01/31/2012   Vitamin D deficiency 01/31/2012   Elevated BP 01/05/2012   Atopic dermatitis 01/04/2012    Past Surgical History:  Procedure Laterality Date   NO PAST SURGERIES      OB History     Gravida  5   Para  3   Term  3   Preterm      AB  2   Living  3      SAB  2   IAB      Ectopic      Multiple      Live Births  3            Home Medications    Prior to Admission medications   Medication Sig Start Date End Date Taking? Authorizing Provider  hydrOXYzine (ATARAX) 25 MG tablet Take 1 tablet (25 mg total) by mouth every 8 (eight) hours as needed for itching. 10/05/22  Yes Flossie Dibble, NP  predniSONE (DELTASONE) 20 MG tablet Take 2 tablets (40 mg total) by mouth daily. 10/05/22  Yes Flossie Dibble, NP  triamcinolone cream (KENALOG) 0.1 % Apply 1 Application topically 2 (two)  times daily. 10/05/22  Yes Flossie Dibble, NP  Cyanocobalamin (VITAMIN B12 PO) Take 1 tablet by mouth daily.    [provider]  lisinopril-hydrochlorothiazide (ZESTORETIC) 20-12.5 MG tablet TAKE 2 TABLETS BY MOUTH DAILY 03/27/22   Hoyt Koch, MD    Family History Family History  Problem Relation Age of Onset   Hypertension Mother    Diabetes Mother    Cancer Father    Stroke Sister 54    Social History Social History   Tobacco Use   Smoking status: Some Days   Smokeless tobacco: Current  Substance Use Topics   Alcohol use: Not Currently   Drug use: Never     Allergies   Patient has no known allergies.   Review of Systems Review of Systems  Constitutional:  Negative for activity change, fatigue and fever.  HENT:  Negative for trouble swallowing and voice change.   Respiratory:  Negative for chest tightness and shortness of breath.   Musculoskeletal:  Negative for neck pain and neck stiffness.  Skin:  Positive for rash.       Rash on RT side of neck      Physical Exam  Triage Vital Signs ED Triage Vitals  Enc Vitals Group     BP 10/05/22 1956 (!) 189/113     Pulse Rate 10/05/22 1956 76     Resp 10/05/22 1956 12     Temp 10/05/22 1956 98.2 F (36.8 C)     Temp Source 10/05/22 1956 Oral     SpO2 10/05/22 1956 100 %     Weight --      Height --      Head Circumference --      Peak Flow --      Pain Score 10/05/22 1954 0     Pain Loc --      Pain Edu? --      Excl. in [place]? --    No data found.  Updated Vital Signs BP (!) 189/113 (BP Location: Right Arm)   Pulse 76   Temp 98.2 F (36.8 C) (Oral)   Resp 12   SpO2 100%    Physical Exam Vitals and nursing note reviewed.  Constitutional:      Appearance: Normal appearance.  Lymphadenopathy:     Cervical: No cervical adenopathy.  Skin:    General: Skin is warm.     Findings: Erythema and rash present. Rash is urticarial.     Comments: No pain upon palpation of site.    Neurological:     Mental Status: She is alert.      UC Treatments / Results  Labs (all labs ordered are listed, but only abnormal results are displayed) Labs Reviewed - No data to display  EKG   Radiology No results found.  Procedures Procedures (including critical care time)  Medications Ordered in UC Medications - No data to display  Initial Impression / Assessment and Plan / UC Course  I have reviewed the triage vital signs and the nursing notes.  Pertinent labs & imaging results that were available during my care of the patient were reviewed by me and considered in my medical decision making (see chart for details).     Patient was treated for rash.  Patient was prescribed hydroxyzine, Kenalog cream, and prednisone. Patient was made aware to begin using the hydroxyzine and kenalog cream tonight and to begin using the prednisone upon waking in the morning. Patient was made aware of potential side effects and medication regimen. Patient was made aware of timeframe for symptom resolution and appropriate procedure for follow-up if necessary. Patient verbalized understanding of instructions.  Final Clinical Impressions(s) / UC Diagnoses   Final diagnoses:  Rash due to allergy     Discharge Instructions      You can start taking the hydroxyzine tonight, this medication can make you sleepy, advised that you do not operate any heavy machinery or car after taking this medication.  You can take this every 8 hours as needed.  Start taking prednisone tomorrow morning, you can take this medication every morning for the next 6 days, take 2 tablets at a time.  You can start applying the Kenalog cream tonight, you can apply this 2 times daily.  Please discontinue use after 4 weeks of use.  Please return if symptoms do not improve within 4 to 5 days.      ED Prescriptions     Medication Sig Dispense Auth. Provider   hydrOXYzine (ATARAX) 25 MG tablet Take 1 tablet (25 mg  total) by mouth every 8 (eight) hours as needed for itching. 12 tablet Flossie Dibble, NP   predniSONE (DELTASONE) 20 MG  tablet Take 2 tablets (40 mg total) by mouth daily. 12 tablet Flossie Dibble, NP   triamcinolone cream (KENALOG) 0.1 % Apply 1 Application topically 2 (two) times daily. 30 g Flossie Dibble, NP      PDMP not reviewed this encounter.   Flossie Dibble, NP 10/06/22 [phone]

## 2022-10-05 NOTE — ED Triage Notes (Signed)
Pt is here for rash on neck since last night

## 2022-10-13 ENCOUNTER — Other Ambulatory Visit: Payer: Self-pay | Admitting: Internal Medicine

## 2023-01-14 ENCOUNTER — Other Ambulatory Visit: Payer: Self-pay | Admitting: Internal Medicine

## 2023-01-14 ENCOUNTER — Ambulatory Visit (HOSPITAL_COMMUNITY)
Admission: EM | Admit: 2023-01-14 | Discharge: 2023-01-14 | Disposition: A | Discharge status: Home / Self Care | Payer: BC Managed Care – PPO | Attending: Internal Medicine | Admitting: Internal Medicine

## 2023-01-14 ENCOUNTER — Encounter (HOSPITAL_COMMUNITY): Payer: Self-pay

## 2023-01-14 DIAGNOSIS — B9689 Other specified bacterial agents as the cause of diseases classified elsewhere: Secondary | ICD-10-CM | POA: Diagnosis not present

## 2023-01-14 DIAGNOSIS — J329 Chronic sinusitis, unspecified: Secondary | ICD-10-CM

## 2023-01-14 MED ORDER — AMOXICILLIN-POT CLAVULANATE 875-125 MG PO TABS: 1.0000 | ORAL_TABLET | Freq: Two times a day (BID) | ORAL | 0 refills | Status: DC

## 2023-01-14 MED ORDER — PROMETHAZINE-DM 6.25-15 MG/5ML PO SYRP: 5.0000 mL | ORAL_SOLUTION | Freq: Every evening | ORAL | 0 refills | Status: DC | PRN

## 2023-01-14 MED ORDER — BENZONATATE 100 MG PO CAPS: 100.0000 mg | ORAL_CAPSULE | Freq: Three times a day (TID) | ORAL | 0 refills | Status: DC

## 2023-01-14 NOTE — ED Triage Notes (Signed)
Pt is here for possible sinus infection x 4wks

## 2023-01-14 NOTE — ED Provider Notes (Signed)
[place]    CSN: [id number] Arrival date & time: 01/14/23  1258      History   Chief Complaint Chief Complaint  Patient presents with   Facial Pain    HPI Judith Payne is a 57 y.o. female.   Patient presents to urgent care for evaluation of nasal congestion and cough for the last 4 weeks.  Patient states symptoms initially began with a viral upper respiratory tract infection but then led into persistent sinusitis symptoms.  She states symptoms improved slightly initially, but have never fully gone away in the last 4 weeks since she has been sick.  No recent known sick contacts with similar symptoms.  She is not currently febrile and has not had a fever/chills over the last 2 weeks.  She does complain of significant nasal congestion and productive cough with associated facial pain to the frontal aspect of the forehead as well as the bilateral cheeks.  No recent antibiotic or steroid use.  She has been using over-the-counter medications to help with symptoms without relief.  Denies shortness of breath, chest pain, heart palpitations, dizziness, abdominal pain, flank pain, difficulty eating, nausea, vomiting, and diarrhea.  Denies history of chronic respiratory problems.  She does have a history of hypertension and states she has been taking her lisinopril-hydrochlorothiazide daily as prescribed.  Blood pressure is currently elevated in clinic at 162/107.  She has taken her blood pressure medication today.  Denies headache, one-sided extremity weakness, and vision changes.     Past Medical History:  Diagnosis Date   Hypertension     Patient Active Problem List   Diagnosis Date Noted   Uterine fibroid 11/09/2019   Routine general medical examination at a health care facility 08/04/2019   Pre-diabetes 08/04/2019   Tobacco abuse 08/04/2019   Essential hypertension 07/31/2015   Cutaneous wart 07/24/2012   Skin tag 07/24/2012   Trichomoniasis 01/31/2012   Vitamin D  deficiency 01/31/2012   Elevated BP 01/05/2012   Atopic dermatitis 01/04/2012    Past Surgical History:  Procedure Laterality Date   NO PAST SURGERIES      OB History     Gravida  5   Para  3   Term  3   Preterm      AB  2   Living  3      SAB  2   IAB      Ectopic      Multiple      Live Births  3            Home Medications    Prior to Admission medications   Medication Sig Start Date End Date Taking? Authorizing Provider  amoxicillin-clavulanate (AUGMENTIN) 875-125 MG tablet Take 1 tablet by mouth every 12 (twelve) hours. 01/14/23  Yes Talbot Grumbling, FNP  benzonatate (TESSALON) 100 MG capsule Take 1 capsule (100 mg total) by mouth every 8 (eight) hours. 01/14/23  Yes Talbot Grumbling, FNP  Cyanocobalamin (VITAMIN B12 PO) Take 1 tablet by mouth daily.   Yes [provider]  lisinopril-hydrochlorothiazide (ZESTORETIC) 20-12.5 MG tablet TAKE 2 TABLETS BY MOUTH DAILY 03/27/22  Yes Hoyt Koch, MD  promethazine-dextromethorphan (PROMETHAZINE-DM) 6.25-15 MG/5ML syrup Take 5 mLs by mouth at bedtime as needed for cough. 01/14/23  Yes Talbot Grumbling, FNP  hydrOXYzine (ATARAX) 25 MG tablet Take 1 tablet (25 mg total) by mouth every 8 (eight) hours as needed for itching. 10/05/22   Flossie Dibble, NP  predniSONE (  DELTASONE) 20 MG tablet Take 2 tablets (40 mg total) by mouth daily. 10/05/22   Flossie Dibble, NP  triamcinolone cream (KENALOG) 0.1 % Apply 1 Application topically 2 (two) times daily. 10/05/22   Flossie Dibble, NP    Family History Family History  Problem Relation Age of Onset   Hypertension Mother    Diabetes Mother    Cancer Father    Stroke Sister 69    Social History Social History   Tobacco Use   Smoking status: Some Days   Smokeless tobacco: Current  Substance Use Topics   Alcohol use: Not Currently   Drug use: Never     Allergies   Patient has no known allergies.   Review of  Systems Review of Systems Per HPI  Physical Exam Triage Vital Signs ED Triage Vitals  Enc Vitals Group     BP 01/14/23 1459 (!) 162/107     Pulse Rate 01/14/23 1459 65     Resp 01/14/23 1459 12     Temp 01/14/23 1459 98 F (36.7 C)     Temp Source 01/14/23 1459 Oral     SpO2 01/14/23 1459 97 %     Weight --      Height --      Head Circumference --      Peak Flow --      Pain Score 01/14/23 1456 0     Pain Loc --      Pain Edu? --      Excl. in [place]? --    No data found.  Updated Vital Signs BP (!) 162/107 (BP Location: Left Arm)   Pulse 65   Temp 98 F (36.7 C) (Oral)   Resp 12   SpO2 97%   Visual Acuity Right Eye Distance:   Left Eye Distance:   Bilateral Distance:    Right Eye Near:   Left Eye Near:    Bilateral Near:     Physical Exam Vitals and nursing note reviewed.  Constitutional:      Appearance: She is not ill-appearing or toxic-appearing.  HENT:     Head: Normocephalic and atraumatic.     Right Ear: Hearing, tympanic membrane, ear canal and external ear normal.     Left Ear: Hearing, tympanic membrane, ear canal and external ear normal.     Nose: Congestion present.     Mouth/Throat:     Lips: Pink.     Mouth: Mucous membranes are moist. No injury.     Tongue: No lesions. Tongue does not deviate from midline.     Palate: No mass and lesions.     Pharynx: Oropharynx is clear. Uvula midline. No pharyngeal swelling, oropharyngeal exudate, posterior oropharyngeal erythema or uvula swelling.     Tonsils: No tonsillar exudate or tonsillar abscesses.  Eyes:     General: Lids are normal. Vision grossly intact. Gaze aligned appropriately.        Right eye: No discharge.        Left eye: No discharge.     Extraocular Movements: Extraocular movements intact.     Conjunctiva/sclera: Conjunctivae normal.  Cardiovascular:     Rate and Rhythm: Normal rate and regular rhythm.     Heart sounds: Normal heart sounds, S1 normal and S2 normal.  Pulmonary:      Effort: Pulmonary effort is normal. No respiratory distress.     Breath sounds: Normal breath sounds and air entry. No wheezing, rhonchi or rales.  Musculoskeletal:  Cervical back: Neck supple.  Skin:    General: Skin is warm and dry.     Capillary Refill: Capillary refill takes less than 2 seconds.     Findings: No rash.  Neurological:     General: No focal deficit present.     Mental Status: She is alert and oriented to person, place, and time. Mental status is at baseline.     Cranial Nerves: No dysarthria or facial asymmetry.  Psychiatric:        Mood and Affect: Mood normal.        Speech: Speech normal.        Behavior: Behavior normal.        Thought Content: Thought content normal.        Judgment: Judgment normal.      UC Treatments / Results  Labs (all labs ordered are listed, but only abnormal results are displayed) Labs Reviewed - No data to display  EKG   Radiology No results found.  Procedures Procedures (including critical care time)  Medications Ordered in UC Medications - No data to display  Initial Impression / Assessment and Plan / UC Course  I have reviewed the triage vital signs and the nursing notes.  Pertinent labs & imaging results that were available during my care of the patient were reviewed by me and considered in my medical decision making (see chart for details).   1.  Bacterial sinusitis Presentation is consistent with acute bacterial sinusitis as symptoms have been persistent for the last 4 weeks, therefore we will provide antibiotic therapy at this time.  Augmentin sent to pharmacy to be taken as prescribed twice daily with food.  Advised to take Mucinex to help with nasal congestion as well.  She may apply warm compresses to the face and take ibuprofen as needed for facial discomfort and pain.  May use Tessalon Perles every 8 hours as needed for cough as well as Promethazine DM cough syrup at bedtime as needed, drowsiness precautions  discussed regarding Promethazine DM syrup use.  Advised to follow-up with PCP for ongoing evaluation and management of symptoms as needed.  Deferred imaging based on stable cardiopulmonary exam and hemodynamically stable vital signs in clinic.  Oxygenating well at 97% on room air.  She is overall nontoxic in appearance.  Discussed physical exam and available lab work findings in clinic with patient.  Counseled patient regarding appropriate use of medications and potential side effects for all medications recommended or prescribed today. Discussed red flag signs and symptoms of worsening condition,when to call the PCP office, return to urgent care, and when to seek higher level of care in the emergency department. Patient verbalizes understanding and agreement with plan. All questions answered. Patient discharged in stable condition.    Final Clinical Impressions(s) / UC Diagnoses   Final diagnoses:  Bacterial sinusitis     Discharge Instructions      Take Augmentin antibiotic as prescribed. Take tessalon perles every 8 hours as needed for cough. Take cough syrup only at bedtime since it can make you sleepy. Purchase regular mucinex (without  the DM) over the counter and take this every 12 hours.   If you develop any new or worsening symptoms or do not improve in the next 2 to 3 days, please return.  If your symptoms are severe, please go to the emergency room.  Follow-up with your primary care provider for further evaluation and management of your symptoms as well as ongoing wellness visits.  I  hope you feel better!   ED Prescriptions     Medication Sig Dispense Auth. Provider   amoxicillin-clavulanate (AUGMENTIN) 875-125 MG tablet Take 1 tablet by mouth every 12 (twelve) hours. 14 tablet Joella Prince M, FNP   benzonatate (TESSALON) 100 MG capsule Take 1 capsule (100 mg total) by mouth every 8 (eight) hours. 21 capsule Talbot Grumbling, FNP   promethazine-dextromethorphan  (PROMETHAZINE-DM) 6.25-15 MG/5ML syrup Take 5 mLs by mouth at bedtime as needed for cough. 118 mL Talbot Grumbling, FNP      PDMP not reviewed this encounter.   Talbot Grumbling, [place] 01/14/23 1601

## 2023-01-14 NOTE — Discharge Instructions (Addendum)
Take Augmentin antibiotic as prescribed. Take tessalon perles every 8 hours as needed for cough. Take cough syrup only at bedtime since it can make you sleepy. Purchase regular mucinex (without  the DM) over the counter and take this every 12 hours.   If you develop any new or worsening symptoms or do not improve in the next 2 to 3 days, please return.  If your symptoms are severe, please go to the emergency room.  Follow-up with your primary care provider for further evaluation and management of your symptoms as well as ongoing wellness visits.  I hope you feel better!

## 2023-02-15 ENCOUNTER — Other Ambulatory Visit: Payer: Self-pay | Admitting: Internal Medicine

## 2023-02-19 ENCOUNTER — Encounter: Payer: Self-pay | Admitting: Emergency Medicine

## 2023-02-19 ENCOUNTER — Ambulatory Visit: Payer: BC Managed Care – PPO | Admitting: Emergency Medicine

## 2023-02-19 VITALS — BP 138/88 | HR 83 | Temp 98.4°F | Ht 63.0 in | Wt 183.0 lb

## 2023-02-19 DIAGNOSIS — L259 Unspecified contact dermatitis, unspecified cause: Secondary | ICD-10-CM

## 2023-02-19 MED ORDER — TRIAMCINOLONE ACETONIDE 0.1 % EX CREA: 1.0000 | TOPICAL_CREAM | Freq: Two times a day (BID) | CUTANEOUS | 1 refills | Status: DC

## 2023-02-19 NOTE — Progress Notes (Signed)
Judith Payne 57 y.o.   Chief Complaint  Patient presents with   Rash    first time this has happened around 2 months ago. This is the second time same area . Pt states it is itchy and has been taking something for allergy and a anti-itch cream.      HISTORY OF PRESENT ILLNESS: Acute problem visit today.  Patient of Dr. Pricilla Holm. This is a 57 y.o. female complaining of itchy rash to right side of her neck that started about 1 week ago Had similar episode 2 months ago.  Responded to topical cream No other associated symptoms No other complaints or medical concerns  Rash Pertinent negatives include no congestion, cough, diarrhea, fever, shortness of breath, sore throat or vomiting.     Prior to Admission medications   Medication Sig Start Date End Date Taking? Authorizing Provider  amoxicillin-clavulanate (AUGMENTIN) 875-125 MG tablet Take 1 tablet by mouth every 12 (twelve) hours. 01/14/23   Talbot Grumbling, FNP  benzonatate (TESSALON) 100 MG capsule Take 1 capsule (100 mg total) by mouth every 8 (eight) hours. 01/14/23   Talbot Grumbling, FNP  Cyanocobalamin (VITAMIN B12 PO) Take 1 tablet by mouth daily.    [provider]  hydrOXYzine (ATARAX) 25 MG tablet Take 1 tablet (25 mg total) by mouth every 8 (eight) hours as needed for itching. 10/05/22   Flossie Dibble, NP  lisinopril-hydrochlorothiazide (ZESTORETIC) 20-12.5 MG tablet TAKE 2 TABLETS BY MOUTH DAILY 01/15/23   Hoyt Koch, MD  predniSONE (DELTASONE) 20 MG tablet Take 2 tablets (40 mg total) by mouth daily. 10/05/22   Flossie Dibble, NP  promethazine-dextromethorphan (PROMETHAZINE-DM) 6.25-15 MG/5ML syrup Take 5 mLs by mouth at bedtime as needed for cough. 01/14/23   Talbot Grumbling, FNP  triamcinolone cream (KENALOG) 0.1 % Apply 1 Application topically 2 (two) times daily. 10/05/22   Flossie Dibble, NP    No Known Allergies  Patient Active Problem List   Diagnosis  Date Noted   Uterine fibroid 11/09/2019   Pre-diabetes 08/04/2019   Tobacco abuse 08/04/2019   Essential hypertension 07/31/2015   Cutaneous wart 07/24/2012   Skin tag 07/24/2012   Vitamin D deficiency 01/31/2012   Atopic dermatitis 01/04/2012    Past Medical History:  Diagnosis Date   Hypertension     Past Surgical History:  Procedure Laterality Date   NO PAST SURGERIES      Social History   Socioeconomic History   Marital status: Married    Spouse name: Not on file   Number of children: Not on file   Years of education: Not on file   Highest education level: Not on file  Occupational History   Not on file  Tobacco Use   Smoking status: Some Days   Smokeless tobacco: Current  Substance and Sexual Activity   Alcohol use: Not Currently   Drug use: Never   Sexual activity: Yes    Partners: Male    Birth control/protection: Post-menopausal  Other Topics Concern   Not on file  Social History Narrative   Not on file   Social Determinants of Health   Financial Resource Strain: Not on file  Food Insecurity: Not on file  Transportation Needs: Not on file  Physical Activity: Not on file  Stress: Not on file  Social Connections: Not on file  Intimate Partner Violence: Not on file    Family History  Problem Relation Age of Onset   Hypertension Mother  Diabetes Mother    Cancer Father    Stroke Sister 7     Review of Systems  Constitutional: Negative.  Negative for chills and fever.  HENT:  Negative for congestion and sore throat.   Respiratory: Negative.  Negative for cough and shortness of breath.   Cardiovascular: Negative.  Negative for chest pain and palpitations.  Gastrointestinal:  Negative for abdominal pain, diarrhea, nausea and vomiting.  Genitourinary: Negative.   Skin:  Positive for itching and rash.  Neurological: Negative.  Negative for dizziness and headaches.  All other systems reviewed and are negative.     Physical Exam Vitals  reviewed.  Constitutional:      Appearance: Normal appearance.  HENT:     Head: Normocephalic.  Eyes:     Extraocular Movements: Extraocular movements intact.  Cardiovascular:     Rate and Rhythm: Normal rate.  Pulmonary:     Effort: Pulmonary effort is normal.  Skin:    General: Skin is warm and dry.     Capillary Refill: Capillary refill takes less than 2 seconds.     Comments: Erythematous macular papular rash to right side of neck into upper back. No vesicles.  No shingles.  Neurological:     General: No focal deficit present.     Mental Status: She is alert and oriented to person, place, and time.  Psychiatric:        Mood and Affect: Mood normal.        Behavior: Behavior normal.      ASSESSMENT & PLAN: A total of 31 minutes was spent with the patient and counseling/coordination of care regarding preparing for this visit, review of most recent office visit notes, review of chronic medical conditions under management, review of all medications, diagnosis of contact dermatitis and treatment, prognosis, documentation, and need for follow-up if no better or worse during the next several days.  Problem List Items Addressed This Visit       Musculoskeletal and Integument   Contact dermatitis - Primary    Clinically stable.  Unknown trigger. Recommend to start triamcinolone cream twice a day for 7 days May need referral to dermatology if no better or worse during the next couple weeks.      Relevant Medications   triamcinolone cream (KENALOG) 0.1 %   Patient Instructions  Contact Dermatitis Dermatitis is when your skin becomes red, sore, and swollen.  Contact dermatitis happens when your body reacts to something that touches the skin. There are 2 types: Irritant contact dermatitis. This is when something bothers your skin, like soap. Allergic contact dermatitis. This is when your skin touches something you are allergic to, like poison ivy. What are the causes? Irritant  contact dermatitis may be caused by: Makeup. Soaps. Detergents. Bleaches. Acids. Metals, like nickel. Allergic contact dermatitis may be caused by: Plants. Chemicals. Jewelry. Latex. Medicines. Preservatives. These are things added to products to help them last longer. There may be some in your clothes. What increases the risk? Having a job where you have to be near things that bother your skin. Having asthma or eczema. What are the signs or symptoms?  Dry or flaky skin. Redness. Cracks. Itching. Moderate symptoms of this condition include: Pain or a burning feeling. Blisters. Blood or clear fluid coming from cracks in your skin. Swelling. This may be on your eyelids, mouth, or genitals. How is this treated? Your doctor will find out what is making your skin react. Then, you can protect your skin. You  may need to use: Steroid creams, ointments, or medicines. Antibiotics or other ointments, if you have a skin infection. Lotion or medicines to help with itching. A bandage. Follow these instructions at home: Skin care Put moisturizer on your skin when it needs it. Put cool, wet cloths on your skin (cool compresses). Put a baking soda paste on your skin. Stir water into baking soda until it looks like a paste. Do not scratch your skin. Try not to have things rub up against your skin. Avoid tight clothing. Avoid using soaps, perfumes, and dyes. Check your skin every day for signs of infection. Check for: More redness, swelling, or pain. More fluid or blood. Warmth. Pus or a bad smell. Medicines Take or apply over-the-counter and prescription medicines only as told by your doctor. If you were prescribed antibiotics, take or apply them as told by your doctor. Do not stop using them even if you start to feel better. Bathing Take a bath with: Epsom salts. Baking soda. Colloidal oatmeal. Bathe less often. Bathe in warm water. Try not to use hot water. Bandage care If  you were given a bandage, change it as told by your doctor. Wash your hands with soap and water for at least 20 seconds before and after you change your bandage. If you cannot use soap and water, use hand sanitizer. General instructions Avoid the things that caused your reaction. If you don't know what caused it, keep a journal. Write down: What you eat. What skin products you use. What you drink. What you wear. Contact a doctor if: You do not get better with treatment. You get worse. You have signs of infection. You have a fever. You have new symptoms. Your bone or joint near the area hurts after the skin has healed. Get help right away if: You see red streaks coming from the area. The area turns darker. You have trouble breathing. This information is not intended to replace advice given to you by your health care provider. Make sure you discuss any questions you have with your health care provider. Document Revised: 05/25/2022 Document Reviewed: 05/25/2022 Elsevier Patient Education  [place], MD [facility]

## 2023-02-19 NOTE — Patient Instructions (Signed)
Contact Dermatitis Dermatitis is when your skin becomes red, sore, and swollen.  Contact dermatitis happens when your body reacts to something that touches the skin. There are 2 types: Irritant contact dermatitis. This is when something bothers your skin, like soap. Allergic contact dermatitis. This is when your skin touches something you are allergic to, like poison ivy. What are the causes? Irritant contact dermatitis may be caused by: Makeup. Soaps. Detergents. Bleaches. Acids. Metals, like nickel. Allergic contact dermatitis may be caused by: Plants. Chemicals. Jewelry. Latex. Medicines. Preservatives. These are things added to products to help them last longer. There may be some in your clothes. What increases the risk? Having a job where you have to be near things that bother your skin. Having asthma or eczema. What are the signs or symptoms?  Dry or flaky skin. Redness. Cracks. Itching. Moderate symptoms of this condition include: Pain or a burning feeling. Blisters. Blood or clear fluid coming from cracks in your skin. Swelling. This may be on your eyelids, mouth, or genitals. How is this treated? Your doctor will find out what is making your skin react. Then, you can protect your skin. You may need to use: Steroid creams, ointments, or medicines. Antibiotics or other ointments, if you have a skin infection. Lotion or medicines to help with itching. A bandage. Follow these instructions at home: Skin care Put moisturizer on your skin when it needs it. Put cool, wet cloths on your skin (cool compresses). Put a baking soda paste on your skin. Stir water into baking soda until it looks like a paste. Do not scratch your skin. Try not to have things rub up against your skin. Avoid tight clothing. Avoid using soaps, perfumes, and dyes. Check your skin every day for signs of infection. Check for: More redness, swelling, or pain. More fluid or blood. Warmth. Pus or  a bad smell. Medicines Take or apply over-the-counter and prescription medicines only as told by your doctor. If you were prescribed antibiotics, take or apply them as told by your doctor. Do not stop using them even if you start to feel better. Bathing Take a bath with: Epsom salts. Baking soda. Colloidal oatmeal. Bathe less often. Bathe in warm water. Try not to use hot water. Bandage care If you were given a bandage, change it as told by your doctor. Wash your hands with soap and water for at least 20 seconds before and after you change your bandage. If you cannot use soap and water, use hand sanitizer. General instructions Avoid the things that caused your reaction. If you don't know what caused it, keep a journal. Write down: What you eat. What skin products you use. What you drink. What you wear. Contact a doctor if: You do not get better with treatment. You get worse. You have signs of infection. You have a fever. You have new symptoms. Your bone or joint near the area hurts after the skin has healed. Get help right away if: You see red streaks coming from the area. The area turns darker. You have trouble breathing. This information is not intended to replace advice given to you by your health care provider. Make sure you discuss any questions you have with your health care provider. Document Revised: 05/25/2022 Document Reviewed: 05/25/2022 Elsevier Patient Education  2023 [employer]  

## 2023-02-19 NOTE — Assessment & Plan Note (Signed)
Clinically stable.  Unknown trigger. Recommend to start triamcinolone cream twice a day for 7 days May need referral to dermatology if no better or worse during the next couple weeks.

## 2023-03-26 ENCOUNTER — Other Ambulatory Visit: Payer: Self-pay | Admitting: Internal Medicine

## 2023-05-04 ENCOUNTER — Other Ambulatory Visit: Payer: Self-pay | Admitting: Internal Medicine

## 2023-05-06 ENCOUNTER — Other Ambulatory Visit: Payer: Self-pay | Admitting: Internal Medicine

## 2023-05-06 DIAGNOSIS — Z0000 Encounter for general adult medical examination without abnormal findings: Secondary | ICD-10-CM

## 2023-05-23 ENCOUNTER — Ambulatory Visit
Admission: RE | Admit: 2023-05-23 | Discharge: 2023-05-23 | Disposition: A | Discharge status: Home / Self Care | Payer: BC Managed Care – PPO | Source: Ambulatory Visit | Attending: Internal Medicine | Admitting: Internal Medicine

## 2023-05-23 DIAGNOSIS — Z0000 Encounter for general adult medical examination without abnormal findings: Secondary | ICD-10-CM

## 2023-05-27 ENCOUNTER — Encounter: Payer: Self-pay | Admitting: Internal Medicine

## 2023-05-27 LAB — HM MAMMOGRAPHY

## 2023-09-12 ENCOUNTER — Ambulatory Visit: Payer: BC Managed Care – PPO | Admitting: Internal Medicine

## 2023-10-03 ENCOUNTER — Ambulatory Visit: Payer: BC Managed Care – PPO | Admitting: Internal Medicine

## 2023-10-03 ENCOUNTER — Encounter: Payer: Self-pay | Admitting: Internal Medicine

## 2023-10-03 VITALS — BP 178/80 | HR 80 | Temp 98.6°F | Ht 63.0 in | Wt 180.0 lb

## 2023-10-03 DIAGNOSIS — Z720 Tobacco use: Secondary | ICD-10-CM

## 2023-10-03 DIAGNOSIS — R7303 Prediabetes: Secondary | ICD-10-CM

## 2023-10-03 DIAGNOSIS — I10 Essential (primary) hypertension: Secondary | ICD-10-CM | POA: Diagnosis not present

## 2023-10-03 DIAGNOSIS — L259 Unspecified contact dermatitis, unspecified cause: Secondary | ICD-10-CM | POA: Diagnosis not present

## 2023-10-03 DIAGNOSIS — L209 Atopic dermatitis, unspecified: Secondary | ICD-10-CM

## 2023-10-03 LAB — COMPREHENSIVE METABOLIC PANEL
ALT: 54 U/L — ABNORMAL HIGH (ref 0–35)
AST: 36 U/L (ref 0–37)
Albumin: 4.5 g/dL (ref 3.5–5.2)
Alkaline Phosphatase: 95 U/L (ref 39–117)
BUN: 16 mg/dL (ref 6–23)
CO2: 30 meq/L (ref 19–32)
Calcium: 10.4 mg/dL (ref 8.4–10.5)
Chloride: 104 meq/L (ref 96–112)
Creatinine, Ser: 0.84 mg/dL (ref 0.40–1.20)
GFR: 76.42 mL/min (ref >60.00)
Glucose, Bld: 106 mg/dL — ABNORMAL HIGH (ref 70–99)
Potassium: 4.2 meq/L (ref 3.5–5.1)
Sodium: 142 meq/L (ref 135–145)
Total Bilirubin: 0.5 mg/dL (ref 0.2–1.2)
Total Protein: 7.9 g/dL (ref 6.0–8.3)

## 2023-10-03 LAB — CBC
HCT: 48.4 % — ABNORMAL HIGH (ref 36.0–46.0)
Hemoglobin: 15.3 g/dL — ABNORMAL HIGH (ref 12.0–15.0)
MCHC: 31.7 g/dL (ref 30.0–36.0)
MCV: 88.6 fL (ref 78.0–100.0)
Platelets: 308 10*3/uL (ref 150.0–400.0)
RBC: 5.45 Mil/uL — ABNORMAL HIGH (ref 3.87–5.11)
RDW: 14.8 % (ref 11.5–15.5)
WBC: 7.8 10*3/uL (ref 4.0–10.5)

## 2023-10-03 LAB — LIPID PANEL
Cholesterol: 234 mg/dL — ABNORMAL HIGH (ref 0–200)
HDL: 48.9 mg/dL (ref >39.00)
LDL Cholesterol: 130 mg/dL — ABNORMAL HIGH (ref 0–99)
NonHDL: 184.95
Total CHOL/HDL Ratio: 5
Triglycerides: 275 mg/dL — ABNORMAL HIGH (ref 0.0–149.0)
VLDL: 55 mg/dL — ABNORMAL HIGH (ref 0.0–40.0)

## 2023-10-03 LAB — HEMOGLOBIN A1C: Hgb A1c MFr Bld: 6.4 % (ref 4.6–6.5)

## 2023-10-03 MED ORDER — LISINOPRIL-HYDROCHLOROTHIAZIDE 20-12.5 MG PO TABS: 2.0000 | ORAL_TABLET | Freq: Every day | ORAL | 0 refills | Status: DC

## 2023-10-03 MED ORDER — TRIAMCINOLONE ACETONIDE 0.1 % EX CREA: 1.0000 | TOPICAL_CREAM | Freq: Two times a day (BID) | CUTANEOUS | 1 refills | Status: AC

## 2023-10-03 NOTE — Progress Notes (Signed)
   Subjective:   Patient ID: Judith Payne, female    DOB: [birth year], 57 y.o.   MRN: [id number]  HPI The patient is a 57 YO female coming in for restablishing care last visit 2022 and has been out of BP meds for some time. Having some headaches and vein in right leg.  Dr Loreta Ave for colonoscopy up to date.  Review of Systems  Constitutional: Negative.   HENT: Negative.    Eyes: Negative.   Respiratory:  Negative for cough, chest tightness and shortness of breath.   Cardiovascular:  Negative for chest pain, palpitations and leg swelling.  Gastrointestinal:  Negative for abdominal distention, abdominal pain, constipation, diarrhea, nausea and vomiting.  Musculoskeletal: Negative.   Skin: Negative.   Neurological:  Positive for headaches.  Psychiatric/Behavioral: Negative.      Objective:  Physical Exam Constitutional:      Appearance: She is well-developed.  HENT:     Head: Normocephalic and atraumatic.  Cardiovascular:     Rate and Rhythm: Normal rate and regular rhythm.  Pulmonary:     Effort: Pulmonary effort is normal. No respiratory distress.     Breath sounds: Normal breath sounds. No wheezing or rales.  Abdominal:     General: Bowel sounds are normal. There is no distension.     Palpations: Abdomen is soft.     Tenderness: There is no abdominal tenderness. There is no rebound.  Musculoskeletal:     Cervical back: Normal range of motion.     Comments: Vein broken on right calf without tenderness, .5 mm superficial clot in the varicose vein  Skin:    General: Skin is warm and dry.  Neurological:     Mental Status: She is alert and oriented to person, place, and time.     Coordination: Coordination normal.     Vitals:   10/03/23 0924 10/03/23 0926  BP: (!) 178/80 (!) 178/80  Pulse: 80   Temp: 98.6 F (37 C)   TempSrc: Oral   SpO2: 94%   Weight: 180 lb (81.6 kg)   Height: 5\' 3"  (1.6 m)     Assessment & Plan:

## 2023-10-03 NOTE — Patient Instructions (Addendum)
We will have  you restart the blood pressure medicine to take 2 pills daily.   Come back in 2-3 weeks to do the physical and recheck the blood pressure.  That spot in the leg is okay and not dangerous.

## 2023-10-04 NOTE — Assessment & Plan Note (Signed)
Not check in 2 years. Checking HgA1c and adjust as neede.d

## 2023-10-04 NOTE — Assessment & Plan Note (Signed)
Rx triamcinolone ointment to use prn on lesions.

## 2023-10-04 NOTE — Assessment & Plan Note (Signed)
BP is moderately high and will resume lisinopril/hydrochlorothiazide 40/25 mg daily. Checking CMP and lipid panel. Needs follow up 3-4 weeks to ensure labs and BP stable on meds as well.

## 2023-10-04 NOTE — Assessment & Plan Note (Signed)
Some day smoker and advised to quit.

## 2023-11-08 ENCOUNTER — Ambulatory Visit: Payer: BC Managed Care – PPO | Admitting: Internal Medicine

## 2023-11-22 ENCOUNTER — Ambulatory Visit: Payer: BC Managed Care – PPO | Admitting: Internal Medicine

## 2023-12-31 ENCOUNTER — Other Ambulatory Visit: Payer: Self-pay | Admitting: Internal Medicine

## 2024-01-03 ENCOUNTER — Ambulatory Visit: Payer: Self-pay | Admitting: Internal Medicine

## 2024-01-31 ENCOUNTER — Ambulatory Visit: Payer: 59 | Admitting: Internal Medicine

## 2024-01-31 ENCOUNTER — Encounter: Payer: Self-pay | Admitting: Internal Medicine

## 2024-01-31 VITALS — BP 152/94 | HR 94 | Temp 98.4°F | Wt 181.0 lb

## 2024-01-31 DIAGNOSIS — I10 Essential (primary) hypertension: Secondary | ICD-10-CM

## 2024-01-31 DIAGNOSIS — R7303 Prediabetes: Secondary | ICD-10-CM

## 2024-01-31 DIAGNOSIS — Z0000 Encounter for general adult medical examination without abnormal findings: Secondary | ICD-10-CM

## 2024-01-31 DIAGNOSIS — Z720 Tobacco use: Secondary | ICD-10-CM | POA: Diagnosis not present

## 2024-01-31 DIAGNOSIS — Z0001 Encounter for general adult medical examination with abnormal findings: Secondary | ICD-10-CM

## 2024-01-31 LAB — COMPREHENSIVE METABOLIC PANEL
ALT: 41 U/L — ABNORMAL HIGH (ref 0–35)
AST: 32 U/L (ref 0–37)
Albumin: 4.4 g/dL (ref 3.5–5.2)
Alkaline Phosphatase: 76 U/L (ref 39–117)
BUN: 21 mg/dL (ref 6–23)
CO2: 29 meq/L (ref 19–32)
Calcium: 9.9 mg/dL (ref 8.4–10.5)
Chloride: 102 meq/L (ref 96–112)
Creatinine, Ser: 0.85 mg/dL (ref 0.40–1.20)
GFR: 75.17 mL/min (ref >60.00)
Glucose, Bld: 120 mg/dL — ABNORMAL HIGH (ref 70–99)
Potassium: 3.8 meq/L (ref 3.5–5.1)
Sodium: 140 meq/L (ref 135–145)
Total Bilirubin: 0.5 mg/dL (ref 0.2–1.2)
Total Protein: 7.6 g/dL (ref 6.0–8.3)

## 2024-01-31 LAB — CBC
HCT: 45.9 % (ref 36.0–46.0)
Hemoglobin: 15.1 g/dL — ABNORMAL HIGH (ref 12.0–15.0)
MCHC: 32.9 g/dL (ref 30.0–36.0)
MCV: 89.3 fL (ref 78.0–100.0)
Platelets: 251 10*3/uL (ref 150.0–400.0)
RBC: 5.14 Mil/uL — ABNORMAL HIGH (ref 3.87–5.11)
RDW: 14.1 % (ref 11.5–15.5)
WBC: 7.8 10*3/uL (ref 4.0–10.5)

## 2024-01-31 LAB — HEMOGLOBIN A1C: Hgb A1c MFr Bld: 6.4 % (ref 4.6–6.5)

## 2024-01-31 NOTE — Assessment & Plan Note (Signed)
 She declines med change today. Taking lisinopril/hydrochlorothiazide 40/25 mg daily. Checking CMP as she has started back on meds since last visit. Adjust as needed.

## 2024-01-31 NOTE — Assessment & Plan Note (Signed)
 Checking HgA1c and adjust as needed.

## 2024-01-31 NOTE — Patient Instructions (Addendum)
 Shingles vaccine think about that.  Call us in 2-3 weeks with the blood pressure levels.

## 2024-01-31 NOTE — Progress Notes (Signed)
   Subjective:   Patient ID: Judith Payne, female    DOB: [birth year], 58 y.o.   MRN: [id number]  HPI The patient is a 58 YO female coming in for physical.   PMH, FMh, social history reviewed and updated  Review of Systems  Constitutional: Negative.   HENT: Negative.    Eyes: Negative.   Respiratory:  Negative for cough, chest tightness and shortness of breath.   Cardiovascular:  Negative for chest pain, palpitations and leg swelling.  Gastrointestinal:  Negative for abdominal distention, abdominal pain, constipation, diarrhea, nausea and vomiting.  Musculoskeletal: Negative.   Skin: Negative.   Neurological: Negative.   Psychiatric/Behavioral: Negative.      Objective:  Physical Exam Constitutional:      Appearance: She is well-developed.  HENT:     Head: Normocephalic and atraumatic.  Cardiovascular:     Rate and Rhythm: Normal rate and regular rhythm.  Pulmonary:     Effort: Pulmonary effort is normal. No respiratory distress.     Breath sounds: Normal breath sounds. No wheezing or rales.  Abdominal:     General: Bowel sounds are normal. There is no distension.     Palpations: Abdomen is soft.     Tenderness: There is no abdominal tenderness. There is no rebound.  Musculoskeletal:     Cervical back: Normal range of motion.  Skin:    General: Skin is warm and dry.  Neurological:     Mental Status: She is alert and oriented to person, place, and time.     Coordination: Coordination normal.     Vitals:   01/31/24 0802 01/31/24 0804 01/31/24 0823  BP: (!) 160/120 (!) 160/120 (!) 152/94  Pulse: 94    Temp: 98.4 F (36.9 C)    TempSrc: Oral    SpO2: 97%    Weight: 181 lb (82.1 kg)      Assessment & Plan:

## 2024-01-31 NOTE — Assessment & Plan Note (Signed)
 Counseled to quit and not able to make attempt.

## 2024-01-31 NOTE — Assessment & Plan Note (Signed)
 Flu shot declines. Shingrix declines. Tetanus declines. Colonoscopy she believes up to date with Dr. Loreta Ave. Mammogram counseled, pap smear up to date. Counseled about sun safety and mole surveillance. Counseled about the dangers of distracted driving. Given 10 year screening recommendations.

## 2024-03-31 ENCOUNTER — Other Ambulatory Visit: Payer: Self-pay | Admitting: Internal Medicine

## 2024-06-28 ENCOUNTER — Other Ambulatory Visit: Payer: Self-pay | Admitting: Internal Medicine

## 2024-07-22 ENCOUNTER — Other Ambulatory Visit: Payer: Self-pay | Admitting: Internal Medicine

## 2024-07-22 DIAGNOSIS — Z1231 Encounter for screening mammogram for malignant neoplasm of breast: Secondary | ICD-10-CM

## 2024-08-31 ENCOUNTER — Ambulatory Visit
Admission: RE | Admit: 2024-08-31 | Discharge: 2024-08-31 | Disposition: A | Source: Ambulatory Visit | Attending: Internal Medicine | Admitting: Internal Medicine

## 2024-08-31 DIAGNOSIS — Z1231 Encounter for screening mammogram for malignant neoplasm of breast: Secondary | ICD-10-CM

## 2024-09-03 ENCOUNTER — Ambulatory Visit: Payer: Self-pay | Admitting: Internal Medicine

## 2024-09-03 LAB — HM MAMMOGRAPHY

## 2024-10-04 ENCOUNTER — Other Ambulatory Visit: Payer: Self-pay | Admitting: Internal Medicine

## 2024-11-05 ENCOUNTER — Other Ambulatory Visit: Payer: Self-pay | Admitting: Internal Medicine

## 2024-12-01 ENCOUNTER — Other Ambulatory Visit: Payer: Self-pay | Admitting: Internal Medicine

## 2024-12-01 ENCOUNTER — Ambulatory Visit: Admitting: Internal Medicine
